# Patient Record
Sex: Male | Born: 1965 | Hispanic: No | Marital: Married | Smoking: Never smoker
Health system: Southern US, Community
[De-identification: ages and names within clinical notes are randomized; demographics above are authoritative.]

## PROBLEM LIST (undated history)

## (undated) DIAGNOSIS — E039 Hypothyroidism, unspecified: Secondary | ICD-10-CM

## (undated) DIAGNOSIS — E669 Obesity, unspecified: Secondary | ICD-10-CM

## (undated) DIAGNOSIS — M109 Gout, unspecified: Secondary | ICD-10-CM

## (undated) DIAGNOSIS — J189 Pneumonia, unspecified organism: Secondary | ICD-10-CM

## (undated) DIAGNOSIS — M722 Plantar fascial fibromatosis: Secondary | ICD-10-CM

## (undated) DIAGNOSIS — E291 Testicular hypofunction: Secondary | ICD-10-CM

## (undated) HISTORY — PX: OTHER SURGICAL HISTORY: SHX169

## (undated) HISTORY — PX: COLONOSCOPY: SHX174

## (undated) HISTORY — DX: Testicular hypofunction: E29.1

## (undated) HISTORY — DX: Pneumonia, unspecified organism: J18.9

## (undated) HISTORY — DX: Gout, unspecified: M10.9

## (undated) HISTORY — DX: Plantar fascial fibromatosis: M72.2

## (undated) HISTORY — DX: Obesity, unspecified: E66.9

---

## 2008-02-13 ENCOUNTER — Ambulatory Visit: Payer: Self-pay | Admitting: Cardiovascular Disease

## 2008-02-13 ENCOUNTER — Observation Stay (HOSPITAL_COMMUNITY): Admission: EM | Admit: 2008-02-13 | Discharge: 2008-02-14 | Payer: Self-pay | Admitting: Emergency Medicine

## 2010-12-18 NOTE — Discharge Summary (Signed)
Aaron Jackson, Aaron Jackson NO.:  [id number]   MEDICAL RECORD NO.:  [id number]          PATIENT TYPE:  OBV   LOCATION:  2035                         FACILITY:  [facility]   PHYSICIAN:  Elmore Guise., M.D.DATE OF BIRTH:  01-08-1966   DATE OF ADMISSION:  02/13/2008  DATE OF DISCHARGE:  02/14/2008                               DISCHARGE SUMMARY   DISCHARGE DIAGNOSIS:  Chest and arm pain.   HISTORY OF PRESENT ILLNESS:  Mr. Lank is a 45 year old male with no  significant past medical history who presented with shoulder, arm and  chest discomfort.  The patient reports he was out at the local swim meet  when he started having some numbness and tingling down his left arm.  This then started radiating to his left chest area.  He became short of  breath.  His symptoms continued despite sitting down, resting, getting  something to drink.  He then came to the emergency room for further  evaluation.  There he was given an aspirin with improvement in his  symptoms.  He was admitted overnight for observation.   HOSPITAL COURSE:  The patient's hospital course was uncomplicated.  He  was admitted for 24-hour observation.  He had no further chest pain.  His enzymes and EKGs were normal.  He will be discharged home today to  be scheduled for an outpatient stress echo.   DISCHARGE MEDICATIONS:  Aspirin 325 mg daily.   He is to call the office if he has any further problems or concerns.      Elmore Guise., M.D.  Electronically Signed     TWK/MEDQ  D:  02/15/2008  T:  02/15/2008  Job:  [id number]

## 2010-12-18 NOTE — H&P (Signed)
NAMENERY, FRAPPIER NO.:  [id number]   MEDICAL RECORD NO.:  [id number]          PATIENT TYPE:  OBV   LOCATION:  2035                         FACILITY:  [facility]   PHYSICIAN:  Christell Faith, MD   DATE OF BIRTH:  June 19, 1966   DATE OF ADMISSION:  02/13/2008  DATE OF DISCHARGE:                              HISTORY & PHYSICAL   Admit to Dr. Lady Deutscher with [facility] cardiology.   The patient does not have a primary care physician.   CHIEF COMPLAINT:  Left arm numbness.   HISTORY OF PRESENT ILLNESS:  This is a 45 year old white man who is  basically healthy.  He was watching his children swim at a swimming  today and noticed left arm numbness and tingling.  This persisted  throughout the day and was intermittently associated with mild left-  sided chest pressure.  It was also associated with intermittent  lightheadedness and left leg numbness.  For all these reasons, he  presented to the emergency department where he is currently symptom-  free.   PAST MEDICAL HISTORY:  None.   SOCIAL HISTORY:  Lives in [place] with his wife and 3 children.  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  He is a nonsmoker,  does not use illegal drugs.   FAMILY HISTORY:  Mother is alive with congestive heart failure at age  32.  Father deceased from lung cancer.  The patient has 6 sisters who  are healthy.   REVIEW OF SYSTEMS:  Positive for recent onset erectile dysfunction and  sedentary lifestyle.  Otherwise, the balance of 14 systems is reviewed  and is negative.   ALLERGIES:  None.   MEDICINES:  None.   PHYSICAL EXAMINATION:  VITAL SIGNS:  Temperature 98.5; pulse initially  103, repeat 85; blood pressure initially 141/84, repeat 125/73; and  saturation initially 94% on room air, repeat 98%.  GENERAL:  This is a very pleasant white man who is in no acute distress.  He appears quite comfortable.  HEENT:  Head normocephalic, atraumatic.  Pupils equal,  round, and  reactive to light.  Extraocular movements are intact.  Sclerae are  clear.  Mucous membranes are moist.  Dentition is good.  NECK:  Supple with no lymphadenopathy.  No carotid bruits.  Neck veins  are flat.  CARDIAC:  Normal rate, regular rhythm.  No murmurs or gallops.  LUNGS:  Clear to auscultation bilaterally without wheezing or rales.  ABDOMEN:  Soft, nontender, and nondistended with normal bowel sounds.  EXTREMITIES:  2+ dorsalis pedis and radial pulses bilaterally.  No  clubbing, cyanosis, or edema.  SKIN:  No rash or lesions.  MUSCULOSKELETAL:  No acute joint deformities or effusions.  NEUROLOGIC:  Awake, alert, and oriented x3.  5/5 strength in all 4  extremities.  Cranial nerves partially tested reveal normal and  symmetric facial expressions.  Tongue midline.  Jaw midline.  Shoulder  shrug normal.  Peripheral sensation is intact.  Facial sensation is  intact.   DIAGNOSTIC TESTS:  Chest x-ray shows no acute process.  Head CT is  unremarkable.  Electrocardiogram sinus rhythm 85 beats per minute, no  ischemic changes.   LABORATORY:  White blood cell 8.1, hemoglobin 13.9, and platelets 234.  Sodium 140, potassium 4, BUN 14, creatinine 1, glucose 93, total  bilirubin 1.1, INR 1.0, and point of care troponin negative.   IMPRESSION:  A 45 year old white man with atypical chest pain symptoms.   PLAN:  1. Admit to Dr. Lady Deutscher, rule out myocardial infarction by      cycling serial EKGs and cardiac enzymes.  The patient will be      monitored on telemetry.  2. Check D-dimer given that the patient travels frequently and does      work while seated for up to 16 consecutive hours throughout the      day.  3. Check a fasting lipid panel and consider statin initiation.  4. Initiate aspirin.  5. The patient will probably benefit from an outpatient stress test.  6. Recommended to the patient that he should establish care with a      primary care physician for health  care maintenance and for further      discussion of his recent onset erectile dysfunction.      Christell Faith, MD  Electronically Signed     NDL/MEDQ  D:  02/13/2008  T:  02/14/2008  Job:  [id number]

## 2011-05-02 LAB — POCT CARDIAC MARKERS
CKMB, poc: 1.3
CKMB, poc: <1 — ABNORMAL LOW
Myoglobin, poc: 193
Myoglobin, poc: 225
Operator id: 151321
Operator id: 294521
Troponin i, poc: <0.05
Troponin i, poc: <0.05

## 2011-05-02 LAB — COMPREHENSIVE METABOLIC PANEL
ALT: 49
AST: 26
Albumin: 4
Alkaline Phosphatase: 65
CO2: 28
Chloride: 107
GFR calc Af Amer: >60
Potassium: 4
Total Bilirubin: 1.1

## 2011-05-02 LAB — CARDIAC PANEL(CRET KIN+CKTOT+MB+TROPI)
CK, MB: 1.9
Relative Index: 0.4
Total CK: 433 — ABNORMAL HIGH
Troponin I: <0.01

## 2011-05-02 LAB — CBC
Platelets: 234
RBC: 4.6
WBC: 8.1

## 2011-05-02 LAB — LIPID PANEL
Triglycerides: 81
VLDL: 16

## 2012-07-05 DIAGNOSIS — J189 Pneumonia, unspecified organism: Secondary | ICD-10-CM

## 2012-07-05 HISTORY — DX: Pneumonia, unspecified organism: J18.9

## 2012-07-21 ENCOUNTER — Inpatient Hospital Stay (HOSPITAL_COMMUNITY)
Admission: AD | Admit: 2012-07-21 | Discharge: 2012-07-25 | DRG: 193 | Disposition: A | Discharge status: Home / Self Care | Payer: Managed Care, Other (non HMO) | Source: Ambulatory Visit | Attending: Internal Medicine | Admitting: Internal Medicine

## 2012-07-21 DIAGNOSIS — R0902 Hypoxemia: Secondary | ICD-10-CM

## 2012-07-21 DIAGNOSIS — Z6831 Body mass index (BMI) 31.0-31.9, adult: Secondary | ICD-10-CM

## 2012-07-21 DIAGNOSIS — E663 Overweight: Secondary | ICD-10-CM | POA: Diagnosis present

## 2012-07-21 DIAGNOSIS — J320 Chronic maxillary sinusitis: Secondary | ICD-10-CM | POA: Diagnosis present

## 2012-07-21 DIAGNOSIS — J189 Pneumonia, unspecified organism: Principal | ICD-10-CM | POA: Diagnosis present

## 2012-07-21 DIAGNOSIS — E039 Hypothyroidism, unspecified: Secondary | ICD-10-CM | POA: Diagnosis present

## 2012-07-21 DIAGNOSIS — J9600 Acute respiratory failure, unspecified whether with hypoxia or hypercapnia: Secondary | ICD-10-CM | POA: Diagnosis present

## 2012-07-21 DIAGNOSIS — Z79899 Other long term (current) drug therapy: Secondary | ICD-10-CM

## 2012-07-21 DIAGNOSIS — J9601 Acute respiratory failure with hypoxia: Secondary | ICD-10-CM | POA: Diagnosis present

## 2012-07-21 HISTORY — DX: Hypothyroidism, unspecified: E03.9

## 2012-07-21 LAB — COMPREHENSIVE METABOLIC PANEL
ALT: 72 U/L — ABNORMAL HIGH (ref 0–53)
AST: 37 U/L (ref 0–37)
Albumin: 2.7 g/dL — ABNORMAL LOW (ref 3.5–5.2)
Calcium: 8.7 mg/dL (ref 8.4–10.5)
GFR calc Af Amer: >90 mL/min (ref >90)
Glucose, Bld: 90 mg/dL (ref 70–99)
Potassium: 3.9 mEq/L (ref 3.5–5.1)
Sodium: 143 mEq/L (ref 135–145)
Total Protein: 6.5 g/dL (ref 6.0–8.3)

## 2012-07-21 LAB — CBC
HCT: 37.2 % — ABNORMAL LOW (ref 39.0–52.0)
Hemoglobin: 12.3 g/dL — ABNORMAL LOW (ref 13.0–17.0)
MCHC: 33.1 g/dL (ref 30.0–36.0)
MCV: 89 fL (ref 78.0–100.0)

## 2012-07-21 MED ORDER — ACETAMINOPHEN 325 MG PO TABS: 650.0000 mg | ORAL_TABLET | Freq: Four times a day (QID) | ORAL | Status: DC | PRN

## 2012-07-21 MED ORDER — SODIUM CHLORIDE 0.9 % IJ SOLN
3.0000 mL | Freq: Two times a day (BID) | INTRAMUSCULAR | Status: DC
  Administered 2012-07-21 – 2012-07-23 (×5): 3 mL via INTRAVENOUS

## 2012-07-21 MED ORDER — ENOXAPARIN SODIUM 40 MG/0.4ML ~~LOC~~ SOLN
40.0000 mg | SUBCUTANEOUS | Status: DC
  Administered 2012-07-21 – 2012-07-24 (×4): 40 mg via SUBCUTANEOUS
  Filled 2012-07-21 (×6): qty 0.4

## 2012-07-21 MED ORDER — ACETAMINOPHEN 650 MG RE SUPP: 650.0000 mg | Freq: Four times a day (QID) | RECTAL | Status: DC | PRN

## 2012-07-21 MED ORDER — SODIUM CHLORIDE 0.9 % IJ SOLN: 3.0000 mL | INTRAMUSCULAR | Status: DC | PRN

## 2012-07-21 MED ORDER — SODIUM CHLORIDE 0.9 % IJ SOLN
3.0000 mL | Freq: Two times a day (BID) | INTRAMUSCULAR | Status: DC
  Administered 2012-07-21 – 2012-07-23 (×3): 3 mL via INTRAVENOUS

## 2012-07-21 MED ORDER — AMOXICILLIN-POT CLAVULANATE 875-125 MG PO TABS
1.0000 | ORAL_TABLET | Freq: Two times a day (BID) | ORAL | Status: DC
  Administered 2012-07-21: 1 via ORAL
  Filled 2012-07-21 (×3): qty 1

## 2012-07-21 MED ORDER — SODIUM CHLORIDE 0.9 % IV SOLN: 250.0000 mL | INTRAVENOUS | Status: AC | PRN

## 2012-07-21 MED ORDER — ALBUTEROL SULFATE (5 MG/ML) 0.5% IN NEBU
2.5000 mg | INHALATION_SOLUTION | RESPIRATORY_TRACT | Status: DC | PRN
  Administered 2012-07-22 (×2): 2.5 mg via RESPIRATORY_TRACT
  Filled 2012-07-21 (×2): qty 0.5

## 2012-07-21 MED ORDER — LEVOTHYROXINE SODIUM 100 MCG PO TABS
100.0000 ug | ORAL_TABLET | Freq: Every day | ORAL | Status: DC
  Administered 2012-07-22 – 2012-07-25 (×4): 100 ug via ORAL
  Filled 2012-07-21 (×5): qty 1

## 2012-07-21 NOTE — H&P (Signed)
Vital Signs  Entered weight:  255  lbs., Calculated Weight: 255 lbs., ( 115.67 kg) Height: 75 in., ( 190.50 cm) Temperature: 98.3 deg F, Temperature site: oral Pulse rate: 84 Pulse rhythm: regular Respirations: 16  Blood Pressure #1: 110 / 60 mm Hg    BMI: 31.87 BSA: 2.43 Wt Chg: -3.13 lbs since 07/13/2012  Vitals entered by: Sara Chu LPN on July 21, 2012 4:40 PM  Pulse Oximetry  O2 Saturation: 84 % Post Nebs O2 Saturation: 84 % Comments: RA  Placed on O2 at 2 L went to 92% After Neb was 90% Ambulated off O2 after Neb at 30 ft desated to 78%  Nebulizer Treatment  Medication 1st Dose: Albuterol Sulfate 2.5mg  Nebulizer Tx: Given Comment: Pt tolerated procedure well.  o2 sats were @ 92 after completion of updraft tx.        Risk Factors:   Tobacco use:  never smoker Passive smoke exposure:  no Drug use:  no HIV high-risk behavior:  no Caffeine use:  3 drinks per day Alcohol use:  yes    Type:  wine twice a week    Drinks per day:  <1 Exercise:  no Seatbelt use:  100 % Sun Exposure:  occasionally  Colonoscopy History:    Date of Last Colonoscopy:  04/13/2010  History of Present Illness  History from: patient Reason for visit: See chief complaint Chief Complaint: Still feeling bad-cough History of Present Illness: Several days of what looked like to be a viral infection, progressed to sinusitis, WBC 7 Got Augmentin, now on 8th day and just not better, sinuses better, but SOB and just winded if he moves around.  Can sit and do desk work but if he gets up and starts walking or has to go up steps, he is out of breath.  No fever, has no colored drainage, ST, ear pain, CP, wheezing n/v, abd pain, back pain, edema Eating ok, but has lost 3 lbs No diarrhea from Augmentin  Seen 07/13/12 for work in [place] and was sick for 1-2 weeks.  Now Throat and vocal cord issues.  B Maxillary Sinus Pressure.  PNgtt.  Tm 102.6..  HA.  coughing with min sputum,   Phlegm sometimes  green in color. (-) Wheezing/SOB.  Ears are not hurting. Pt feels weak but not achey. Tylenol prn.  light sensitivity.  He had Fever for 10 days prior to that OV.  Problem # 1:  HEADACHE (ICD-784.0) Sxs have develepoed and sound like Maxilary Sinusitis Treat with Abx.  Problem # 2:  FEVER UNSPECIFIED (ICD-780.60) Now > 7 days and no longer seems Viral.  Problem # 3:  MAXILLARY SINUSITIS (ICD-473.0) Will Treat with Augmentin with Probitics.  RBSEs and alternatives discussed. Also MMW. I scraped off the thick coating. Did not seem thrush??  He called 12/12 with continued fevers and BENZONATATE 200 MG CAPS (BENZONATATE) 1 po tid prn and  PREDNISONE 20 MG TABS (PREDNISONE) 1 po qd x 4 days called in.  No Longer having Fevers but hypoxic   Review of Systems  General:       Complains of fatigue, weight loss.        Denies fevers, chills, sweats, anorexia, malaise, sleep disturbance.   Eyes:       Denies irritation, discharge.   Ears/Nose/Throat:       Denies earache, ear discharge, decreased hearing, nasal congestion, nosebleeds, sore throat.   Cardiovascular:       Complains of dyspnea on exertion.  Denies chest pains, palpitations, syncope, orthopnea, peripheral edema.   Respiratory:       Complains of cough, dyspnea.        Denies excessive sputum, hemoptysis, wheezing.   Gastrointestinal:       Denies nausea, vomiting, diarrhea, constipation, heartburn, abdominal pain, melena, hematochezia.   Musculoskeletal:       Denies back pain, muscle cramps, muscle weakness.   Skin:       Denies rash, itching, dryness.   Neurologic:       Denies vertigo, dizziness.   Endocrine:       Denies polydipsia, polyphagia, polyuria.   Heme/Lymphatic:       Denies abnormal bruising, bleeding, enlarged lymph nodes.   Allergic/Immunologic:       Complains of persistent infections.        Denies hay fever.    Past History Past Medical History (reviewed - no changes required): Abnormal  TSH/Hypothyroidism,  Low HDL,  Gout/Elevated Uric acid,  hypogonadism Atypical Chest pain admission; 7/09; numbness (L) arm; CCT (-), CXR (-), Serum 2D echo (-) Foot, toe, head issues Overweight Plantar Fasciitis.  Surgical History (reviewed - no changes required): none Social History (reviewed - no changes required): Married with 3 boys Education: Masters at [employer] Occupation: Scientist, research (physical sciences) No tobacco.    Physical Exam  General appearance: well nourished, well hydrated, diaphoresis  Eyes  External: conjunctivae and lids normal  Ears, Nose and Throat  Nasal: clear drainage  Pharynx: mildly injected post phayrnx  Neck  Neck: supple, no masses, trachea midline  Respiratory  Respiratory effort: no intercostal retractions or use of accessory muscles Auscultation: no rales, rhonchi, or wheezes  Cardiovascular  Auscultation: S1, S2, no murmur, rub, or gallop Periph. circulation: no cyanosis, clubbing, edema  Gastrointestinal  Abdomen: soft, non-tender, no masses, bowel sounds normal  Lymphatic  Neck: no cervical adenopathy  Musculoskeletal  Gait and station: normal  Skin  Inspection: pale, w/d Speech: Normal  Mental Status Exam  Judgment, insight: intact Orientation: oriented to time, place, and person Memory: intact   Impression & Recommendations:  Problem # 1:  Cough (ICD-786.2) ([id number]) Assessment: Deteriorated  Orders: Chest X-Ray (Pa & L) (CPT-71020)  LLL hazy per Dr Timothy Lasso CBC/Platelets [phone])  WBC  11 Pulse Ox ([id number]) see notes  ? PNA and Hypoxic - Admit for eval and Rx and O2/CTPA   Orders: Chest X-Ray (Pa & L) (CPT-71020) CBC/Platelets ([id number]) Pulse Ox ([id number]) Peak Flow ([id number])   Problem # 2:  Hypoxia (ICD-799.02) (ICD10-R09.01) Assessment: New  Orders: Chest X-Ray (Pa & L) (CPT-71020) Pulse Ox ([id number]) Peak Flow ([id number]) Inhalation Treatment ([id number]) Deteriorated to 78%  with ambulatio After assessment and discussion with Dr Timothy Lasso, will admit to hospital for eval and management Pt is advised and he states understanding He is to be transported by his wife to direct admissione  Problem # 3:  MAXILLARY SINUSITIS (ICD-473.0) (ICD10-J32.0) Assessment: Improved  His updated medication list for this problem includes:    Benzonatate 200 Mg Caps (Benzonatate) .Marland Kitchen... 1 po tid prn    Augmentin 875-125 Mg Tabs (Amoxicillin-pot clavulanate) ..... One po bid   Problem # 4:  FATIGUE (ICD-780.79) (VFI43-P29.51) Assessment: Deteriorated  Complete Medication List: 1)  Benzonatate 200 Mg Caps (Benzonatate) .Marland Kitchen.. 1 po tid prn 2)  Augmentin 875-125 Mg Tabs (Amoxicillin-pot clavulanate) .... One po bid 3)  Synthroid 100 Mcg Tabs (Levothyroxine sodium) .Marland Kitchen.. 1 po qd   Comments: ADMIT    Electronically signed by  Gwen Pounds MD on 07/21/2012 at 5:33 PM  ________________________________________________________________________

## 2012-07-22 ENCOUNTER — Inpatient Hospital Stay (HOSPITAL_COMMUNITY): Payer: Managed Care, Other (non HMO)

## 2012-07-22 ENCOUNTER — Encounter (HOSPITAL_COMMUNITY): Payer: Self-pay | Admitting: Radiology

## 2012-07-22 LAB — MRSA PCR SCREENING: MRSA by PCR: NEGATIVE

## 2012-07-22 MED ORDER — ALBUTEROL SULFATE (5 MG/ML) 0.5% IN NEBU
2.5000 mg | INHALATION_SOLUTION | Freq: Two times a day (BID) | RESPIRATORY_TRACT | Status: DC
  Administered 2012-07-22 (×2): 2.5 mg via RESPIRATORY_TRACT
  Filled 2012-07-22: qty 0.5

## 2012-07-22 MED ORDER — PIPERACILLIN-TAZOBACTAM 3.375 G IVPB 30 MIN: 3.3750 g | Freq: Four times a day (QID) | INTRAVENOUS | Status: DC

## 2012-07-22 MED ORDER — VANCOMYCIN HCL IN DEXTROSE 1-5 GM/200ML-% IV SOLN
1000.0000 mg | Freq: Once | INTRAVENOUS | Status: AC
  Administered 2012-07-22: 1000 mg via INTRAVENOUS
  Filled 2012-07-22: qty 200

## 2012-07-22 MED ORDER — IOHEXOL 350 MG/ML SOLN
80.0000 mL | Freq: Once | INTRAVENOUS | Status: AC | PRN
  Administered 2012-07-22: 80 mL via INTRAVENOUS

## 2012-07-22 MED ORDER — PIPERACILLIN-TAZOBACTAM 3.375 G IVPB
3.3750 g | Freq: Three times a day (TID) | INTRAVENOUS | Status: DC
  Administered 2012-07-22 – 2012-07-24 (×7): 3.375 g via INTRAVENOUS
  Filled 2012-07-22 (×10): qty 50

## 2012-07-22 MED ORDER — METHYLPREDNISOLONE SODIUM SUCC 40 MG IJ SOLR
40.0000 mg | INTRAMUSCULAR | Status: AC
Start: 2012-07-22 — End: 2012-07-23
  Administered 2012-07-22 – 2012-07-23 (×2): 40 mg via INTRAVENOUS
  Filled 2012-07-22 (×2): qty 1

## 2012-07-22 NOTE — Progress Notes (Signed)
Patient was on r/a eating lunch, denies c/o, checked oxygen sat 79%.  Patient denied shortness of breath or pain in any way. Placed patient back on 2 liters [place] but oxygen sats still 81-84%.  Respiratory rate 18-20, lung sounds diminished with a productive cough of white phlem.  Other vss bp 109/61 hr 99 patient afebrile.  1250 gave patient a breathing treatment and monitored oxygen sats.  1315 patient oxygen sats still in the 80"s even with coughing and deep breathing and encouraging incentive spirameter but sat's did not improve.  I called rapid response rn to assess patient (patient still is not symptomatic) - she increased oxygen to 4liters [place] and observed with oxygen sats 88% (patient denies any c/o's of pain or sob).  Patient at times increased sat to 90% for a second but still remained at 88% on four liters [place].  1400 Call placed to dr Russo's office and dr Jarold Motto on call and notified of patient's oxygen needs and orders received.  Respiratory called to assist with venturi mask to get sat's greater then 90%.  Continue to monitor

## 2012-07-22 NOTE — Progress Notes (Signed)
Subjective: Admitted with 2.5 weeks of illness. Fever Cough etc. Was improving on Augmentin but was getting more SOB and presented to our office yesterday with significant hypoxia - Sats into 78% on RA while moving. Admitted for eval and Rx. CTPA - No PE but multifocal PNA. Will Rx with O2, Nebs, Pulm Toilet, time, and will switch from oral to 2 days IV Abx. Will also add IV Steroids.  Objective: Vital signs in last 24 hours: Temp:  [97.5 F (36.4 C)-97.9 F (36.6 C)] 97.9 F (36.6 C) (12/18 0449) Pulse Rate:  [80-98] 85  (12/18 0449) Resp:  [18] 18  (12/18 0449) BP: (96-129)/(58-74) 96/58 mmHg (12/18 0449) SpO2:  [89 %-96 %] 96 % (12/18 0449) Weight:  [114.851 kg (253 lb 3.2 oz)] 114.851 kg (253 lb 3.2 oz) (12/17 2029) Weight change:  Last BM Date: 07/20/12  CBG (last 3)  No results found for this basename: GLUCAP:3 in the last 72 hours  Intake/Output from previous day:  Intake/Output Summary (Last 24 hours) at 07/22/12 0811 Last data filed at 07/22/12 0641  Gross per 24 hour  Intake    720 ml  Output    700 ml  Net     20 ml   12/17 0701 - 12/18 0700 In: 720 [P.O.:720] Out: 700 [Urine:700]   Physical Exam  General appearance: Looks fine in bed wearing Oxygen Eyes: no scleral icterus Throat: oropharynx moist without erythema Resp: Distant and clear Cardio: Reg GI: soft, non-tender; bowel sounds normal; no masses,  no organomegaly Extremities: no clubbing, cyanosis or edema   Lab Results:  [facility] 07/21/12 2044  NA 143  K 3.9  CL 104  CO2 29  GLUCOSE 90  BUN 16  CREATININE 1.02  CALCIUM 8.7  MG --  PHOS --     Basename 07/21/12 2044  AST 37  ALT 72*  ALKPHOS 71  BILITOT 0.4  PROT 6.5  ALBUMIN 2.7*     Basename 07/21/12 2044  WBC 10.8*  NEUTROABS --  HGB 12.3*  HCT 37.2*  MCV 89.0  PLT 403*    Lab Results  Component Value Date   INR 1.0 02/13/2008    Micro Results: No results found for this or any previous visit (from the past  240 hour(s)).   Studies/Results: Ct Angio Chest Pe W/cm &/or Wo Cm  07/22/2012  *RADIOLOGY REPORT*  Clinical Data: Hypoxia, productive cough.  CT ANGIOGRAPHY CHEST  Technique:  Multidetector CT imaging of the chest using the standard protocol during bolus administration of intravenous contrast. Multiplanar reconstructed images including MIPs were obtained and reviewed to evaluate the vascular anatomy.  Contrast: 80mL OMNIPAQUE IOHEXOL 350 MG/ML SOLN  Comparison: 02/13/2008 radiograph  Findings: Pulmonary arterial branches are patent.  Heart size upper normal.  There is a small pericardial effusion.  No pleural effusions.  No intrathoracic lymphadenopathy.  Normal caliber aorta and branch vessels.  Limited images through the upper abdomen show no acute finding. Hepatic steatosis suggested.  Central airways are patent. There is a clustered nodularity and peribronchial thickening involving the right upper lobe anteriorly, lingula, middle lobe, and bilateral lower lobe.  Densest area of consolidation is within the left lung base posteriorly.  T4 vertebral body hemangioma.  No acute or aggressive osseous lesion.  IMPRESSION: No pulmonary embolism.  Multifocal areas of consolidation, peribronchial thickening, and clustered nodularity.  Most in keeping with multifocal pneumonia. There may be an underlying process as well.  Therefore, recommend a follow-up chest CT after appropriate therapy  to document resolution.  Small pericardial effusion.   Original Report Authenticated By: Jearld Lesch, M.D.      Medications: Scheduled:    . albuterol  2.5 mg Nebulization Q12H  . enoxaparin (LOVENOX) injection  40 mg Subcutaneous Q24H  . levothyroxine  100 mcg Oral QAC breakfast  . methylPREDNISolone (SOLU-MEDROL) injection  40 mg Intravenous Q24H  . piperacillin-tazobactam (ZOSYN)  IV  3.375 g Intravenous Q8H  . sodium chloride  3 mL Intravenous Q12H  . sodium chloride  3 mL Intravenous Q12H  . vancomycin   1,000 mg Intravenous Once   Continuous:    Assessment/Plan: Active Problems:  Hypoxia  Pneumonia  Hypothyroid  Clinical and CT picture C/W Multifocal PNA and Hypoxia.  A follow-up chest CT will be performed after appropriate therapy to document resolution.  CXR tomorrow. Will Rx with O2, Nebs, Pulm Toilet, time, and will switch from oral to 2 days IV Abx. Will also add IV Steroids x 2 days. On D/C he may need outpt O2.   ID -  Anti-infectives     Start     Dose/Rate Route Frequency Ordered Stop   07/22/12 0830   piperacillin-tazobactam (ZOSYN) IVPB 3.375 g        3.375 g 12.5 mL/hr over 240 Minutes Intravenous 3 times per day 07/22/12 0809     07/22/12 0815   piperacillin-tazobactam (ZOSYN) IVPB 3.375 g  Status:  Discontinued        3.375 g 100 mL/hr over 30 Minutes Intravenous 4 times per day 07/22/12 0805 07/22/12 0808   07/22/12 0815   vancomycin (VANCOCIN) IVPB 1000 mg/200 mL premix        1,000 mg 200 mL/hr over 60 Minutes Intravenous  Once 07/22/12 0805     07/21/12 2200   amoxicillin-clavulanate (AUGMENTIN) 875-125 MG per tablet 1 tablet  Status:  Discontinued        1 tablet Oral Every 12 hours 07/21/12 2024 07/22/12 0805         DVT Prophylaxis    LOS: 1 day   Kysha Muralles M 07/22/2012, 8:11 AM

## 2012-07-22 NOTE — Progress Notes (Signed)
Nutrition Brief Note  Patient identified on the Malnutrition Screening Tool (MST) Report. Upon further chart review, pt is eating most of his meals. States that his appetite is improving daily. Pt unable to state his usual weight. Per H&P, pt with 3 lb weight loss.  Body mass index is 31.65 kg/(m^2). Pt meets criteria for Obese Class I based on current BMI.   Current diet order is Regular, patient is consuming approximately 75-100% of meals at this time. Labs and medications reviewed.   No nutrition interventions warranted at this time. If nutrition issues arise, please consult RD.   Aaron Motto MS, RD, LDN Pager: [phone] After-hours pager: [phone]

## 2012-07-23 ENCOUNTER — Inpatient Hospital Stay (HOSPITAL_COMMUNITY): Payer: Managed Care, Other (non HMO)

## 2012-07-23 DIAGNOSIS — J9601 Acute respiratory failure with hypoxia: Secondary | ICD-10-CM | POA: Diagnosis present

## 2012-07-23 DIAGNOSIS — J189 Pneumonia, unspecified organism: Principal | ICD-10-CM

## 2012-07-23 DIAGNOSIS — J9600 Acute respiratory failure, unspecified whether with hypoxia or hypercapnia: Secondary | ICD-10-CM

## 2012-07-23 LAB — CBC
HCT: 36.3 % — ABNORMAL LOW (ref 39.0–52.0)
MCH: 28.8 pg (ref 26.0–34.0)
MCV: 89.4 fL (ref 78.0–100.0)
RDW: 13.5 % (ref 11.5–15.5)
WBC: 9.9 10*3/uL (ref 4.0–10.5)

## 2012-07-23 LAB — COMPREHENSIVE METABOLIC PANEL
BUN: 14 mg/dL (ref 6–23)
CO2: 27 mEq/L (ref 19–32)
Chloride: 104 mEq/L (ref 96–112)
Creatinine, Ser: 1.12 mg/dL (ref 0.50–1.35)
GFR calc non Af Amer: 76 mL/min — ABNORMAL LOW (ref >90)
Total Bilirubin: 0.3 mg/dL (ref 0.3–1.2)

## 2012-07-23 MED ORDER — ALBUTEROL SULFATE HFA 108 (90 BASE) MCG/ACT IN AERS
2.0000 | INHALATION_SPRAY | Freq: Three times a day (TID) | RESPIRATORY_TRACT | Status: DC
  Filled 2012-07-23: qty 6.7

## 2012-07-23 MED ORDER — LEVOFLOXACIN IN D5W 750 MG/150ML IV SOLN
750.0000 mg | INTRAVENOUS | Status: AC
  Administered 2012-07-23 – 2012-07-24 (×2): 750 mg via INTRAVENOUS
  Filled 2012-07-23 (×2): qty 150

## 2012-07-23 NOTE — Consult Note (Signed)
Patient: Aaron Jackson DOB: [birth year] Date of Admission: 07/21/2012            [facility]   Date of Consult: 07/23/2012 MD requesting consult:  Timothy Lasso  Reason for consult: PNA, hypoxia   HPI -  46yo male never smoker with minimal PMH seen as outpt initially 12/9 with fevers, viral illness, ?sinusitis.  At that time flu, strep were neg.  Failed augmentin, returned to PCP 12/17 with >2 weeks fever, cough, progressive SOB and wheezing.  In office had O2 sats 84%, 78% with ambulation.  He was directly admitted r/t hypoxia after failing outpt rx.  CT chest was neg for PE but showed multifocal PNA and PCCM consulted.      Allergies:  No Known Allergies   PMH: Past Medical History  Diagnosis Date  . Hypothyroidism     Home meds: Medications Prior to Admission  Medication Sig Dispense Refill  . Acetaminophen (TYLENOL PO) Take 2 tablets by mouth every 4 (four) hours as needed. For headache      . Alum & Mag Hydroxide-Simeth (MAGIC MOUTHWASH) SOLN Take 10 mLs by mouth 4 (four) times daily as needed. For white patches in mouth      . amoxicillin-clavulanate (AUGMENTIN) 875-125 MG per tablet Take 1 tablet by mouth 2 (two) times daily. For 10 days started 12.9.13 ending 12.19.13      . benzonatate (TESSALON) 100 MG capsule Take 100 mg by mouth 3 (three) times daily as needed.      Marland Kitchen levothyroxine (SYNTHROID, LEVOTHROID) 100 MCG tablet Take 100 mcg by mouth daily.      . predniSONE (DELTASONE) 20 MG tablet Take 20 mg by mouth daily. For 4 days. Started 12.13.13 ending 12.17.13         Social Hx: History   Social History  . Marital Status: Married    Spouse Name: N/A    Number of Children: N/A  . Years of Education: N/A   Occupational History  . Not on file.   Social History Main Topics  . Smoking status: Never Smoker   . Smokeless tobacco: Not on file  . Alcohol Use: 1.2 oz/week    2 Glasses of wine per week  . Drug Use:   . Sexually Active:    Other Topics Concern  . Not  on file   Social History Narrative  . No narrative on file     Family Hx: Family History  Problem Relation Age of Onset  . Congestive Heart Failure Mother   . Lung cancer Father      ROS: Review of Systems - C/o mild SOB although improved, cough productive white sputum.  Denies chest pain, hemoptysis, lightheadedness.  All other systems reviewed and were neg.   Filed Vitals:   07/22/12 1800 07/22/12 1900 07/23/12 0014 07/23/12 0720  BP: 122/78 121/79 110/66 120/72  Pulse: 95 97 94 80  Temp:  98.3 F (36.8 C) 98.1 F (36.7 C) 97.9 F (36.6 C)  TempSrc:  Oral Oral Oral  Resp: 21 19 14 16   Height:      Weight:   251 lb 12.3 oz (114.2 kg)   SpO2: 91% 93% 88% 90%    chest X-ray Dg Chest 2 View  07/23/2012  *RADIOLOGY REPORT*  Clinical Data: Follow up pneumonia, cough  CHEST - 2 VIEW  Comparison: 07/22/2012  Findings: Cardiomediastinal silhouette is stable.  Again noted left basilar atelectasis or scarring.  Persistent left perihilar streaky infiltrate.  No pulmonary edema.  IMPRESSION:   Again noted left basilar atelectasis or scarring.  Persistent left perihilar streaky infiltrate.  No pulmonary edema.   Original Report Authenticated By: Natasha Mead, M.D.    Ct Angio Chest Pe W/cm &/or Wo Cm  07/22/2012  *RADIOLOGY REPORT*  Clinical Data: Hypoxia, productive cough.  CT ANGIOGRAPHY CHEST  Technique:  Multidetector CT imaging of the chest using the standard protocol during bolus administration of intravenous contrast. Multiplanar reconstructed images including MIPs were obtained and reviewed to evaluate the vascular anatomy.  Contrast: 80mL OMNIPAQUE IOHEXOL 350 MG/ML SOLN  Comparison: 02/13/2008 radiograph  Findings: Pulmonary arterial branches are patent.  Heart size upper normal.  There is a small pericardial effusion.  No pleural effusions.  No intrathoracic lymphadenopathy.  Normal caliber aorta and branch vessels.  Limited images through the upper abdomen show no acute finding.  Hepatic steatosis suggested.  Central airways are patent. There is a clustered nodularity and peribronchial thickening involving the right upper lobe anteriorly, lingula, middle lobe, and bilateral lower lobe.  Densest area of consolidation is within the left lung base posteriorly.  T4 vertebral body hemangioma.  No acute or aggressive osseous lesion.  IMPRESSION: No pulmonary embolism.  Multifocal areas of consolidation, peribronchial thickening, and clustered nodularity.  Most in keeping with multifocal pneumonia. There may be an underlying process as well.  Therefore, recommend a follow-up chest CT after appropriate therapy to document resolution.  Small pericardial effusion.   Original Report Authenticated By: Jearld Lesch, M.D.    Dg Chest Port 1 View  07/22/2012  *RADIOLOGY REPORT*  Clinical Data: Pneumonia, increased oxygen requirements  PORTABLE CHEST - 1 VIEW  Comparison: Portable exam 1443 hours compared to 02/13/2008  Findings: Borderline enlargement of cardiac silhouette. Mediastinal contours and pulmonary vascularity normal. Left basilar atelectasis and questionable left perihilar infiltrate. No pleural effusion or pneumothorax. No bones unremarkable.  IMPRESSION: Left basilar atelectasis and questionable left perihilar infiltrate.   Original Report Authenticated By: Ulyses Southward, M.D.      CBC    Component Value Date/Time   WBC 9.9 07/23/2012 0428   RBC 4.06* 07/23/2012 0428   HGB 11.7* 07/23/2012 0428   HCT 36.3* 07/23/2012 0428   PLT 378 07/23/2012 0428   MCV 89.4 07/23/2012 0428   MCH 28.8 07/23/2012 0428   MCHC 32.2 07/23/2012 0428   RDW 13.5 07/23/2012 0428     BMET    Component Value Date/Time   NA 139 07/23/2012 0428   K 4.0 07/23/2012 0428   CL 104 07/23/2012 0428   CO2 27 07/23/2012 0428   GLUCOSE 105* 07/23/2012 0428   BUN 14 07/23/2012 0428   CREATININE 1.12 07/23/2012 0428   CALCIUM 8.8 07/23/2012 0428   GFRNONAA 76* 07/23/2012 0428   GFRAA 88*  07/23/2012 0428      EXAM: General: young wdwn male NAD OOB in chair  Neuro: awake, alert, appropriate, MAE  CV: s1s2 rrr PULM: resps even non labored on 5L [place], bibasilar crackles GI: abd soft, +bs Extremities:  Warm and dry, no edema    IMPRESSION/ PLAN:  CAP - multifocal Hypoxia - improved. CT neg PE.  ?Sinusitis   PLAN -  -Cont abx -- will cont vanc/zosyn and add levaquin for atypical coverage for now given prolonged abx as outpt, but low threshold to narrow abx.  -Sputum culture  -Doubt role for steroids - d/c  -Change BD to PRN only  -O2 as needed  -Mobilize  -Aggressive pulm hygiene  -Will need f/u imaging  to ensure resolution-- pt concerned as his dad recently died of "aggressive lung cancer" and was never smoker -CXR in am   See addendum below  [facility], NP 07/23/2012  8:16 AM Pager: [phone]  *Care during the described time interval was provided by me and/or other providers on the critical care team. I have reviewed this patient's available data, including medical history, events of note, physical examination and test results as part of my evaluation.   PCCM Attending addendum: His history suggests an initial viral infection 3 wks ago or so c/b acute sinusitis which was appropriately treated with Augmentin. He no has a cxr and ct chest suggesting bibasilar pneumonia or noninfectious inflammatory process. His care by Dr Timothy Lasso has been impeccable and there is little I would change. I agree with steroids and abx as ordered. He can be safely discharged when he feels that he has the strength to perform ADLs sufficiently. He might require temporary O2 @ discharge until this process resolves. Perhaps the most important thing I have added is reassurance to him that this is not cancer and that it is a reversible process (even if we cannot fully define it)  -Would complete 10 days Levofloxacin -Suggest prednisone 60 mg daily tapering to off over 10-14  days -Assess home O2 needs prior to discharge (it will only be temporary!)  If RA SpO2 is > 85%, he probably doesn't need O2 -Follow up CXR in a week and again in 2-3 wks.  -If symptoms and/or CXR fail to respond to the above therapies, suggest re-eval by pulmonary medicine as outpt   Billy Fischer, MD ; [facility] [phone].  After 5:30 PM or weekends, call [phone]

## 2012-07-23 NOTE — Progress Notes (Signed)
Subjective: In hospital for Multifocal PNA and Hypoxia. Had Worsening Resp status yesterday and moved to Step-Down. He reports he moved @ room without his O2 on and when they checked his sats he was in 70%s. He did not feel any worse. He feels fine at rest. He is starting to cough some stuff up. I updated him and his sister of plan - Sister = RN asked for pulm consult which is reasonable.    Objective: Vital signs in last 24 hours: Temp:  [97.9 F (36.6 C)-98.4 F (36.9 C)] 97.9 F (36.6 C) (12/19 0720) Pulse Rate:  [80-112] 80  (12/19 0720) Resp:  [14-21] 16  (12/19 0720) BP: (98-131)/(61-79) 120/72 mmHg (12/19 0720) SpO2:  [84 %-96 %] 90 % (12/19 0720) FiO2 (%):  [50 %] 50 % (12/18 1444) Weight:  [114.2 kg (251 lb 12.3 oz)-115.5 kg (254 lb 10.1 oz)] 114.2 kg (251 lb 12.3 oz) (12/19 0014) Weight change: 0.649 kg (1 lb 6.9 oz) Last BM Date: 07/20/12  CBG (last 3)  No results found for this basename: GLUCAP:3 in the last 72 hours  Intake/Output from previous day:  Intake/Output Summary (Last 24 hours) at 07/23/12 0751 Last data filed at 07/23/12 0000  Gross per 24 hour  Intake    350 ml  Output   2000 ml  Net  -1650 ml   12/18 0701 - 12/19 0700 In: 350 [IV Piggyback:350] Out: 2000 [Urine:2000]   Physical Exam  General appearance: Looks fine in bed wearing Oxygen - sats 88% on FIO2 after getting back in bed.  Quickly jumped to 95% at rest on FIO2 Eyes: no scleral icterus Throat: oropharynx moist without erythema Resp: Distant and clear Cardio: Reg GI: soft, non-tender; bowel sounds normal; no masses,  no organomegaly Extremities: no clubbing, cyanosis or edema   Lab Results:  [facility] 07/23/12 0428 07/21/12 2044  NA 139 143  K 4.0 3.9  CL 104 104  CO2 27 29  GLUCOSE 105* 90  BUN 14 16  CREATININE 1.12 1.02  CALCIUM 8.8 8.7  MG -- --  PHOS -- --     Basename 07/23/12 0428 07/21/12 2044  AST 32 37  ALT 72* 72*  ALKPHOS 65 71  BILITOT 0.3 0.4   PROT 6.4 6.5  ALBUMIN 2.6* 2.7*     Basename 07/23/12 0428 07/21/12 2044  WBC 9.9 10.8*  NEUTROABS -- --  HGB 11.7* 12.3*  HCT 36.3* 37.2*  MCV 89.4 89.0  PLT 378 403*    Lab Results  Component Value Date   INR 1.0 02/13/2008    Micro Results: Recent Results (from the past 240 hour(s))  MRSA PCR SCREENING     Status: Normal   Collection Time   07/22/12  5:18 PM      Component Value Range Status Comment   MRSA by PCR NEGATIVE  NEGATIVE Final      Studies/Results: Ct Angio Chest Pe W/cm &/or Wo Cm  07/22/2012  *RADIOLOGY REPORT*  Clinical Data: Hypoxia, productive cough.  CT ANGIOGRAPHY CHEST  Technique:  Multidetector CT imaging of the chest using the standard protocol during bolus administration of intravenous contrast. Multiplanar reconstructed images including MIPs were obtained and reviewed to evaluate the vascular anatomy.  Contrast: 80mL OMNIPAQUE IOHEXOL 350 MG/ML SOLN  Comparison: 02/13/2008 radiograph  Findings: Pulmonary arterial branches are patent.  Heart size upper normal.  There is a small pericardial effusion.  No pleural effusions.  No intrathoracic lymphadenopathy.  Normal caliber aorta and branch vessels.  Limited images through the upper abdomen show no acute finding. Hepatic steatosis suggested.  Central airways are patent. There is a clustered nodularity and peribronchial thickening involving the right upper lobe anteriorly, lingula, middle lobe, and bilateral lower lobe.  Densest area of consolidation is within the left lung base posteriorly.  T4 vertebral body hemangioma.  No acute or aggressive osseous lesion.  IMPRESSION: No pulmonary embolism.  Multifocal areas of consolidation, peribronchial thickening, and clustered nodularity.  Most in keeping with multifocal pneumonia. There may be an underlying process as well.  Therefore, recommend a follow-up chest CT after appropriate therapy to document resolution.  Small pericardial effusion.   Original Report  Authenticated By: Jearld Lesch, M.D.    Dg Chest Port 1 View  07/22/2012  *RADIOLOGY REPORT*  Clinical Data: Pneumonia, increased oxygen requirements  PORTABLE CHEST - 1 VIEW  Comparison: Portable exam 1443 hours compared to 02/13/2008  Findings: Borderline enlargement of cardiac silhouette. Mediastinal contours and pulmonary vascularity normal. Left basilar atelectasis and questionable left perihilar infiltrate. No pleural effusion or pneumothorax. No bones unremarkable.  IMPRESSION: Left basilar atelectasis and questionable left perihilar infiltrate.   Original Report Authenticated By: Ulyses Southward, M.D.      Medications: Scheduled:    . albuterol  2 puff Inhalation TID  . enoxaparin (LOVENOX) injection  40 mg Subcutaneous Q24H  . levothyroxine  100 mcg Oral QAC breakfast  . methylPREDNISolone (SOLU-MEDROL) injection  40 mg Intravenous Q24H  . piperacillin-tazobactam (ZOSYN)  IV  3.375 g Intravenous Q8H  . sodium chloride  3 mL Intravenous Q12H  . sodium chloride  3 mL Intravenous Q12H   Continuous:    Assessment/Plan: Active Problems:  Hypoxia  Pneumonia  Hypothyroid  Clinical and CT picture C/W Multifocal PNA and Hypoxia.  A follow-up chest CT will be performed after appropriate therapy to document resolution.  CXR today not very impressive = perihilar PNA- continue O2, Nebs, Pulm Toilet, time, IV Abx and IV Steroids x 2 days.  Will ask Pulm for help. On D/C he may need outpt O2. Hypothyroid on Rx.   ID -  Anti-infectives     Start     Dose/Rate Route Frequency Ordered Stop   07/22/12 0830   piperacillin-tazobactam (ZOSYN) IVPB 3.375 g        3.375 g 12.5 mL/hr over 240 Minutes Intravenous 3 times per day 07/22/12 0809     07/22/12 0815   piperacillin-tazobactam (ZOSYN) IVPB 3.375 g  Status:  Discontinued        3.375 g 100 mL/hr over 30 Minutes Intravenous 4 times per day 07/22/12 0805 07/22/12 0808   07/22/12 0815   vancomycin (VANCOCIN) IVPB 1000 mg/200 mL premix         1,000 mg 200 mL/hr over 60 Minutes Intravenous  Once 07/22/12 0805 07/22/12 1123   07/21/12 2200   amoxicillin-clavulanate (AUGMENTIN) 875-125 MG per tablet 1 tablet  Status:  Discontinued        1 tablet Oral Every 12 hours 07/21/12 2024 07/22/12 0805         DVT Prophylaxis    LOS: 2 days   Tylyn Stankovich M 07/23/2012, 7:51 AM

## 2012-07-23 NOTE — Progress Notes (Signed)
Utilization Review Completed.Aaron Jackson T12/19/2013   

## 2012-07-24 ENCOUNTER — Inpatient Hospital Stay (HOSPITAL_COMMUNITY): Payer: Managed Care, Other (non HMO)

## 2012-07-24 LAB — CBC
Hemoglobin: 12 g/dL — ABNORMAL LOW (ref 13.0–17.0)
Platelets: 382 10*3/uL (ref 150–400)
RBC: 4.21 MIL/uL — ABNORMAL LOW (ref 4.22–5.81)
WBC: 10.2 10*3/uL (ref 4.0–10.5)

## 2012-07-24 MED ORDER — LEVOFLOXACIN 750 MG PO TABS
750.0000 mg | ORAL_TABLET | Freq: Every day | ORAL | Status: DC
Start: 2012-07-25 — End: 2012-07-25
  Administered 2012-07-25: 750 mg via ORAL
  Filled 2012-07-24: qty 1

## 2012-07-24 MED ORDER — PREDNISONE 20 MG PO TABS
40.0000 mg | ORAL_TABLET | Freq: Every day | ORAL | Status: DC
  Administered 2012-07-24 – 2012-07-25 (×2): 40 mg via ORAL
  Filled 2012-07-24 (×4): qty 2

## 2012-07-24 NOTE — Care Management Note (Unsigned)
    Page 1 of 1   07/24/2012     3:49:04 PM   CARE MANAGEMENT NOTE 07/24/2012  Patient:  Aaron Jackson, Aaron Jackson   Account Number:  [id number]  Date Initiated:  07/24/2012  Documentation initiated by:  GRAVES-BIGELOW,Nayla Dias  Subjective/Objective Assessment:   Pt admitted with bad cough. Treating for PNA with Iv abx changed to po.     Action/Plan:   CM did speak to pt about possible home 02 and he is agreeable if needed. Pt has CIgna and will have to use [employer] for DME 02. RN is aware to ambulat pt and to see if qualifies for home 02. Qualifying sats/order for 02 will need to be faxed.   Anticipated DC Date:  07/26/2012   Anticipated DC Plan:  HOME/SELF CARE      DC Planning Services  CM consult      PAC Choice  DURABLE MEDICAL EQUIPMENT   Choice offered to / List presented to:  C-1 Patient           Status of service:  In process, will continue to follow Medicare Important Message given?   (If response is "NO", the following Medicare IM given date fields will be blank) Date Medicare IM given:   Date Additional Medicare IM given:    Discharge Disposition:    Per UR Regulation:  Reviewed for med. necessity/level of care/duration of stay  If discussed at Long Length of Stay Meetings, dates discussed:    Comments:  07-24-12 [address] Tomi Bamberger, RN,BSN [phone] Christoper Allegra [phone]  [phone]. Weekend CM will fax information  order and qualifying sats to [employer] if pt qualifies for 02 and they will deliver DME to hospital. RN please call weekend CM for assistance. Thanks

## 2012-07-24 NOTE — Progress Notes (Signed)
Subjective: In hospital for Multifocal PNA and Hypoxia. Was able to move bact to tele yesterday Appreciate Pulm eval Feeling well when lstaying still. Tachypnea when moving Cough with min sputum.  Objective: Vital signs in last 24 hours: Temp:  [97.6 F (36.4 C)-98.7 F (37.1 C)] 98.1 F (36.7 C) (12/20 0500) Pulse Rate:  [78-95] 80  (12/20 0500) Resp:  [17-24] 18  (12/20 0500) BP: (109-119)/(65-74) 109/65 mmHg (12/20 0500) SpO2:  [90 %-93 %] 90 % (12/20 0500) Weight change:  Last BM Date: 07/23/12  CBG (last 3)  No results found for this basename: GLUCAP:3 in the last 72 hours  Intake/Output from previous day:  Intake/Output Summary (Last 24 hours) at 07/24/12 0729 Last data filed at 07/24/12 0500  Gross per 24 hour  Intake  187.5 ml  Output   1775 ml  Net -1587.5 ml   12/19 0701 - 12/20 0700 In: 187.5 [IV Piggyback:187.5] Out: 1775 [Urine:1775]   Physical Exam  General appearance: Looks fine in bed wearing Oxygen.  4L Eyes: no scleral icterus Throat: oropharynx moist without erythema Resp: Distant and clear Cardio: Reg GI: soft, non-tender; bowel sounds normal; no masses,  no organomegaly Extremities: no clubbing, cyanosis or edema   Lab Results:  [facility] 07/23/12 0428 07/21/12 2044  NA 139 143  K 4.0 3.9  CL 104 104  CO2 27 29  GLUCOSE 105* 90  BUN 14 16  CREATININE 1.12 1.02  CALCIUM 8.8 8.7  MG -- --  PHOS -- --     Basename 07/23/12 0428 07/21/12 2044  AST 32 37  ALT 72* 72*  ALKPHOS 65 71  BILITOT 0.3 0.4  PROT 6.4 6.5  ALBUMIN 2.6* 2.7*     Basename 07/24/12 0510 07/23/12 0428  WBC 10.2 9.9  NEUTROABS -- --  HGB 12.0* 11.7*  HCT 38.0* 36.3*  MCV 90.3 89.4  PLT 382 378    Lab Results  Component Value Date   INR 1.0 02/13/2008    Micro Results: Recent Results (from the past 240 hour(s))  MRSA PCR SCREENING     Status: Normal   Collection Time   07/22/12  5:18 PM      Component Value Range Status Comment   MRSA by PCR  NEGATIVE  NEGATIVE Final      Studies/Results: Dg Chest 2 View  07/23/2012  *RADIOLOGY REPORT*  Clinical Data: Follow up pneumonia, cough  CHEST - 2 VIEW  Comparison: 07/22/2012  Findings: Cardiomediastinal silhouette is stable.  Again noted left basilar atelectasis or scarring.  Persistent left perihilar streaky infiltrate.  No pulmonary edema.  IMPRESSION:   Again noted left basilar atelectasis or scarring.  Persistent left perihilar streaky infiltrate.  No pulmonary edema.   Original Report Authenticated By: Natasha Mead, M.D.    Dg Chest Port 1 View  07/22/2012  *RADIOLOGY REPORT*  Clinical Data: Pneumonia, increased oxygen requirements  PORTABLE CHEST - 1 VIEW  Comparison: Portable exam 1443 hours compared to 02/13/2008  Findings: Borderline enlargement of cardiac silhouette. Mediastinal contours and pulmonary vascularity normal. Left basilar atelectasis and questionable left perihilar infiltrate. No pleural effusion or pneumothorax. No bones unremarkable.  IMPRESSION: Left basilar atelectasis and questionable left perihilar infiltrate.   Original Report Authenticated By: Ulyses Southward, M.D.      Medications: Scheduled:    . enoxaparin (LOVENOX) injection  40 mg Subcutaneous Q24H  . levofloxacin (LEVAQUIN) IV  750 mg Intravenous Q24H  . levothyroxine  100 mcg Oral QAC breakfast  . piperacillin-tazobactam (ZOSYN)  IV  3.375 g Intravenous Q8H  . sodium chloride  3 mL Intravenous Q12H  . sodium chloride  3 mL Intravenous Q12H   Continuous:    Assessment/Plan: Active Problems:  Hypoxia  Pneumonia  Hypothyroid  Acute respiratory failure with hypoxia  Clinical and CT picture C/W Multifocal PNA and Hypoxia.  Appreciate Pulm eval yesterday A follow-up chest CT may be performed after appropriate therapy to document resolution.  CXRs not very impressive = perihilar PNA - continue O2, Nebs, Pulm Toilet, time,  Abx and Steroids.  On D/C he may need outpt O2. Dr Sung Amabile recommended: -Would  complete 10 days Levofloxacin .Marland KitchenOk to switch to oral after next dose. -Suggest prednisone 60 mg daily tapering to off over 10-14 days. I started 40 as he just had two big doses IV Solumedrol  -Assess home O2 needs prior to discharge (it will only be temporary!)  If RA SpO2 is > 85%, he probably doesn't need O2  -Follow up CXR in a week and again in 2-3 wks.  -If symptoms and/or CXR fail to respond to the above therapies, suggest re-eval by pulmonary medicine as outpt I Ordered case management with the comment "He will need to get set up with Home health to supply Oxygen. He will be going home over the weekend and will need FIO2." See what can be arranged today.  Hypothyroid on Rx.   ID -  Anti-infectives     Start     Dose/Rate Route Frequency Ordered Stop   07/23/12 1000   levofloxacin (LEVAQUIN) IVPB 750 mg        750 mg 100 mL/hr over 90 Minutes Intravenous Every 24 hours 07/23/12 0937     07/22/12 0830  piperacillin-tazobactam (ZOSYN) IVPB 3.375 g       3.375 g 12.5 mL/hr over 240 Minutes Intravenous 3 times per day 07/22/12 0809     07/22/12 0815   piperacillin-tazobactam (ZOSYN) IVPB 3.375 g  Status:  Discontinued        3.375 g 100 mL/hr over 30 Minutes Intravenous 4 times per day 07/22/12 0805 07/22/12 0808   07/22/12 0815   vancomycin (VANCOCIN) IVPB 1000 mg/200 mL premix        1,000 mg 200 mL/hr over 60 Minutes Intravenous  Once 07/22/12 0805 07/22/12 1123   07/21/12 2200   amoxicillin-clavulanate (AUGMENTIN) 875-125 MG per tablet 1 tablet  Status:  Discontinued        1 tablet Oral Every 12 hours 07/21/12 2024 07/22/12 0805         DVT Prophylaxis    LOS: 3 days   Cathalina Barcia M 07/24/2012, 7:29 AM

## 2012-07-25 MED ORDER — LEVOFLOXACIN 750 MG PO TABS: 750.0000 mg | ORAL_TABLET | Freq: Every day | ORAL | Status: DC

## 2012-07-25 NOTE — Discharge Summary (Signed)
DISCHARGE SUMMARY  Aaron Jackson  MR#: [id number]  DOB:1966-04-20  Date of Admission: 07/21/2012 Date of Discharge: 07/25/2012  Attending Physician:Aaron Jackson  Patient's Aaron Jackson  Consults:  none  Discharge Diagnoses: Active Problems:  Hypoxia  Pneumonia  Hypothyroid  Acute respiratory failure with hypoxia   Discharge Medications:   Medication List     As of 07/25/2012 10:21 AM    STOP taking these medications         amoxicillin-clavulanate 875-125 MG per tablet   Commonly known as: AUGMENTIN      magic mouthwash Soln      TAKE these medications         benzonatate 100 MG capsule   Commonly known as: TESSALON   Take 100 mg by mouth 3 (three) times daily as needed.      levofloxacin 750 MG tablet   Commonly known as: LEVAQUIN   Take 1 tablet (750 mg total) by mouth daily.      levothyroxine 100 MCG tablet   Commonly known as: SYNTHROID, LEVOTHROID   Take 100 mcg by mouth daily.      predniSONE 20 MG tablet   Commonly known as: DELTASONE   Take 20 mg by mouth daily. For 4 days. Started 12.13.13 ending 12.17.13      TYLENOL PO   Take 2 tablets by mouth every 4 (four) hours as needed. For headache        Hospital Procedures: Dg Chest 2 View  07/23/2012  *RADIOLOGY REPORT*  Clinical Data: Follow up pneumonia, cough  CHEST - 2 VIEW  Comparison: 07/22/2012  Findings: Cardiomediastinal silhouette is stable.  Again noted left basilar atelectasis or scarring.  Persistent left perihilar streaky infiltrate.  No pulmonary edema.  IMPRESSION:   Again noted left basilar atelectasis or scarring.  Persistent left perihilar streaky infiltrate.  No pulmonary edema.   Original Report Authenticated By: Aaron Jackson, M.D.    Ct Angio Chest Pe W/cm &/or Wo Cm  07/22/2012  *RADIOLOGY REPORT*  Clinical Data: Hypoxia, productive cough.  CT ANGIOGRAPHY CHEST  Technique:  Multidetector CT imaging of the chest using the standard protocol during bolus  administration of intravenous contrast. Multiplanar reconstructed images including MIPs were obtained and reviewed to evaluate the vascular anatomy.  Contrast: 80mL OMNIPAQUE IOHEXOL 350 MG/ML SOLN  Comparison: 02/13/2008 radiograph  Findings: Pulmonary arterial branches are patent.  Heart size upper normal.  There is Jackson small pericardial effusion.  No pleural effusions.  No intrathoracic lymphadenopathy.  Normal caliber aorta and branch vessels.  Limited images through the upper abdomen show no acute finding. Hepatic steatosis suggested.  Central airways are patent. There is Jackson clustered nodularity and peribronchial thickening involving the right upper lobe anteriorly, lingula, middle lobe, and bilateral lower lobe.  Densest area of consolidation is within the left lung base posteriorly.  T4 vertebral body hemangioma.  No acute or aggressive osseous lesion.  IMPRESSION: No pulmonary embolism.  Multifocal areas of consolidation, peribronchial thickening, and clustered nodularity.  Most in keeping with multifocal pneumonia. There may be an underlying process as well.  Therefore, recommend Jackson follow-up chest CT after appropriate therapy to document resolution.  Small pericardial effusion.   Original Report Authenticated By: Aaron Jackson, M.D.    Dg Chest Portable 1 View  07/24/2012  *RADIOLOGY REPORT*  Clinical Data: Cough, shortness of breath  PORTABLE CHEST - 1 VIEW  Comparison: 07/23/2012  Findings: Slight increased bibasilar streaky densities, suspect atelectasis.  Stable heart size and vascularity.  No CHF, effusion, pneumothorax.  Trachea midline.  Improving left hilar aeration.  IMPRESSION: Increased bibasilar atelectasis.   Original Report Authenticated By: Aaron Jackson, M.D.    Dg Chest Port 1 View  07/22/2012  *RADIOLOGY REPORT*  Clinical Data: Pneumonia, increased oxygen requirements  PORTABLE CHEST - 1 VIEW  Comparison: Portable exam 1443 hours compared to 02/13/2008  Findings: Borderline enlargement  of cardiac silhouette. Mediastinal contours and pulmonary vascularity normal. Left basilar atelectasis and questionable left perihilar infiltrate. No pleural effusion or pneumothorax. No bones unremarkable.  IMPRESSION: Left basilar atelectasis and questionable left perihilar infiltrate.   Original Report Authenticated By: Ulyses Southward, M.D.     History of Present Illness: Several days of what looked like to be Jackson viral infection, progressed to sinusitis, WBC 7  Got Augmentin, now on 8th day and just not better, sinuses better, but SOB and just winded if he moves around. Can sit and do desk work but if he gets up and starts walking or has to go up steps, he is out of breath. 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 Now Throat and vocal cord issues. B Maxillary Sinus Pressure. PNgtt. Tm 102.6.. HA. coughing with min sputum, Phlegm sometimes green in color. (-) Wheezing/SOB. Ears are not hurting. Pt feels weak but not achey. Tylenol prn. light sensitivity. He had Fever for 10 days prior to that OV.   Hospital Course: Patient was admitted to the medical service placed on IV antibiotics, IV fluids and bronchodilators. He was also supplemented with oxygen and steroids. He improved dramatically over his hospital course. Was mobilized. Did not qualify for home oxygen. Discharge in improved and stable condition after having been observed on orals for at least 24 hours. Day of Discharge Exam BP 135/61  Pulse 74  Temp 97.5 F (36.4 C) (Oral)  Resp 19  Ht 6\' 3"  (1.905 m)  Wt 114.2 kg (251 lb 12.3 oz)  BMI 31.47 kg/m2  SpO2 92%  Physical Exam: Patient is awake alert no distress ready for discharge. No dyspnea with exertion. No JVD or bruits. Lungs are clear. Cardiovascular exam regular rate and rhythm. Abdomen is benign. No peripheral  edema. Neurologically he is normal.   Discharge Labs:  [facility] 07/23/12 0428  NA 139  K 4.0  CL 104  CO2 27  GLUCOSE 105*  BUN 14  CREATININE 1.12  CALCIUM 8.8  MG --  PHOS --    Basename 07/23/12 0428  AST 32  ALT 72*  ALKPHOS 65  BILITOT 0.3  PROT 6.4  ALBUMIN 2.6*    Basename 07/24/12 0510 07/23/12 0428  WBC 10.2 9.9  NEUTROABS -- --  HGB 12.0* 11.7*  HCT 38.0* 36.3*  MCV 90.3 89.4  PLT 382 378   No results found for this basename: CKTOTAL:3,CKMB:3,CKMBINDEX:3,TROPONINI:3 in the last 72 hours No results found for this basename: TSH,T4TOTAL,FREET3,T3FREE,THYROIDAB in the last 72 hours No results found for this basename: VITAMINB12:2,FOLATE:2,FERRITIN:2,TIBC:2,IRON:2,RETICCTPCT:2 in the last 72 hours  Discharge instructions:     Discharge Orders    Future Orders Please Complete By Expires   Diet - low sodium heart healthy      Increase activity slowly         Disposition: *Home Follow-up Appts: Follow-up with Dr. Jacky Kindle at [facility] in *  Dr. Timothy Lasso one to 2 weeks  Call for appointment.  Condition on Discharge: *Stable improved Tests Needing Follow-up: *Non- Signed: Jontae Adebayo Jackson 07/25/2012, 10:21 AM

## 2012-07-25 NOTE — Progress Notes (Signed)
CARE MANAGEMENT NOTE 07/25/2012  Comments:  07/25/12 )930 Vance Peper, RN ,BSN [phone] Patient doesnt meet qualifications for home oxygen. Sats on ambulation charted by bedside RN. Case Manager signing off.

## 2012-07-25 NOTE — Progress Notes (Signed)
Pt ambulated on room air 500 ft.  Oxygen saturations decreased to 89% and recovered spontaneously to 92% without supplemental oxygen.  Julie-Ann Vanmaanen, [facility]

## 2013-07-05 HISTORY — PX: WISDOM TOOTH EXTRACTION: SHX21

## 2014-06-09 ENCOUNTER — Encounter: Payer: Self-pay | Admitting: Internal Medicine

## 2014-08-08 ENCOUNTER — Ambulatory Visit (AMBULATORY_SURGERY_CENTER): Payer: Managed Care, Other (non HMO) | Admitting: *Deleted

## 2014-08-08 VITALS — Ht 72.0 in | Wt 235.0 lb

## 2014-08-08 DIAGNOSIS — Z1211 Encounter for screening for malignant neoplasm of colon: Secondary | ICD-10-CM

## 2014-08-08 MED ORDER — MOVIPREP 100 G PO SOLR: 1.0000 | Freq: Once | ORAL | Status: DC

## 2014-08-08 NOTE — Progress Notes (Signed)
No egg or soy allergy. ewm No issues with past sedation. ewm No diet pills.ewm emmi video to pt's e mail. ewm

## 2014-08-15 ENCOUNTER — Encounter: Payer: Managed Care, Other (non HMO) | Admitting: Internal Medicine

## 2014-08-31 IMAGING — CR DG CHEST 1V PORT
1 series · 1 of 1 positions shown · non-contrast
Comparison: Portable exam 4000 hours compared to 02/13/2008

CLINICAL DATA: Pneumonia, increased oxygen requirements

PORTABLE CHEST - 1 VIEW

[AP]
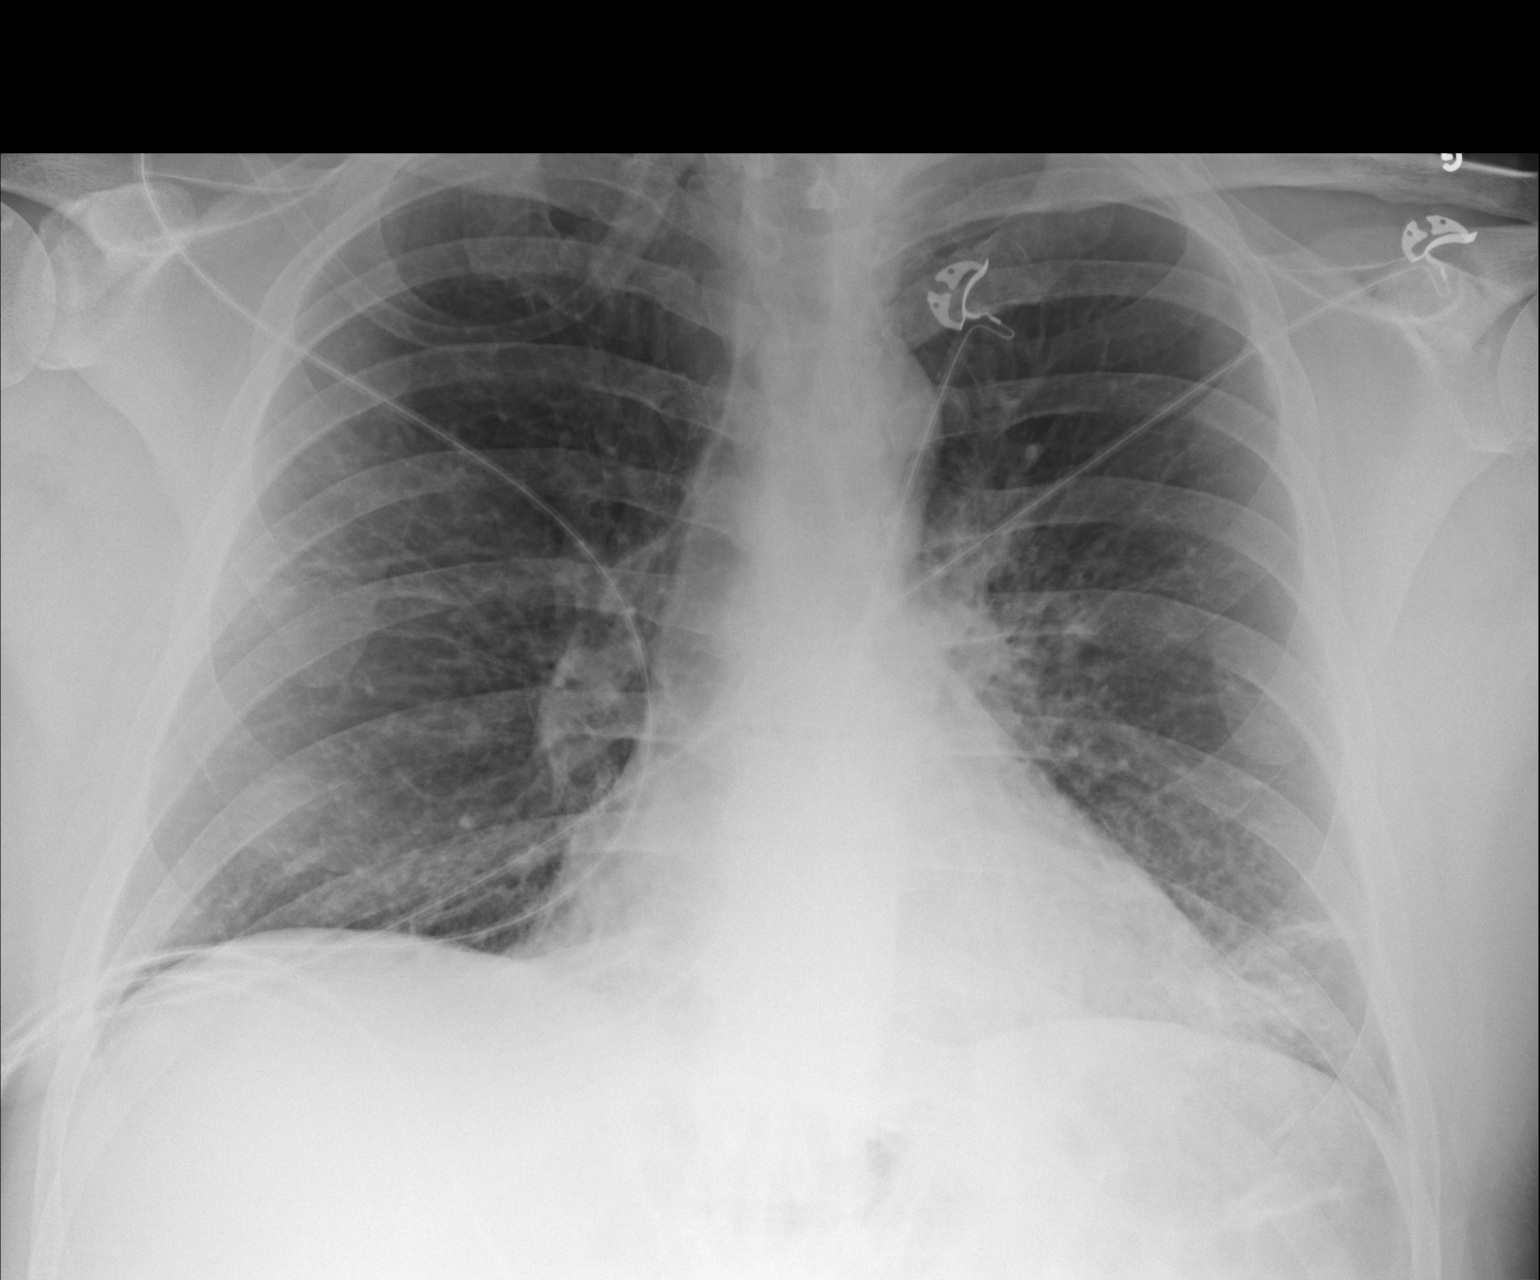

[1 of 1 positions shown; findings below may reference images not displayed]

FINDINGS: Borderline enlargement of cardiac silhouette.
Mediastinal contours and pulmonary vascularity normal.
Left basilar atelectasis and questionable left perihilar
infiltrate.
No pleural effusion or pneumothorax.
No bones unremarkable.
IMPRESSION: Left basilar atelectasis and questionable left perihilar
infiltrate.

## 2014-09-01 IMAGING — CR DG CHEST 2V
2 series · 2 of 2 positions shown · non-contrast
Comparison: 07/22/2012

CLINICAL DATA: Follow up pneumonia, cough

CHEST - 2 VIEW

[w chest pa]
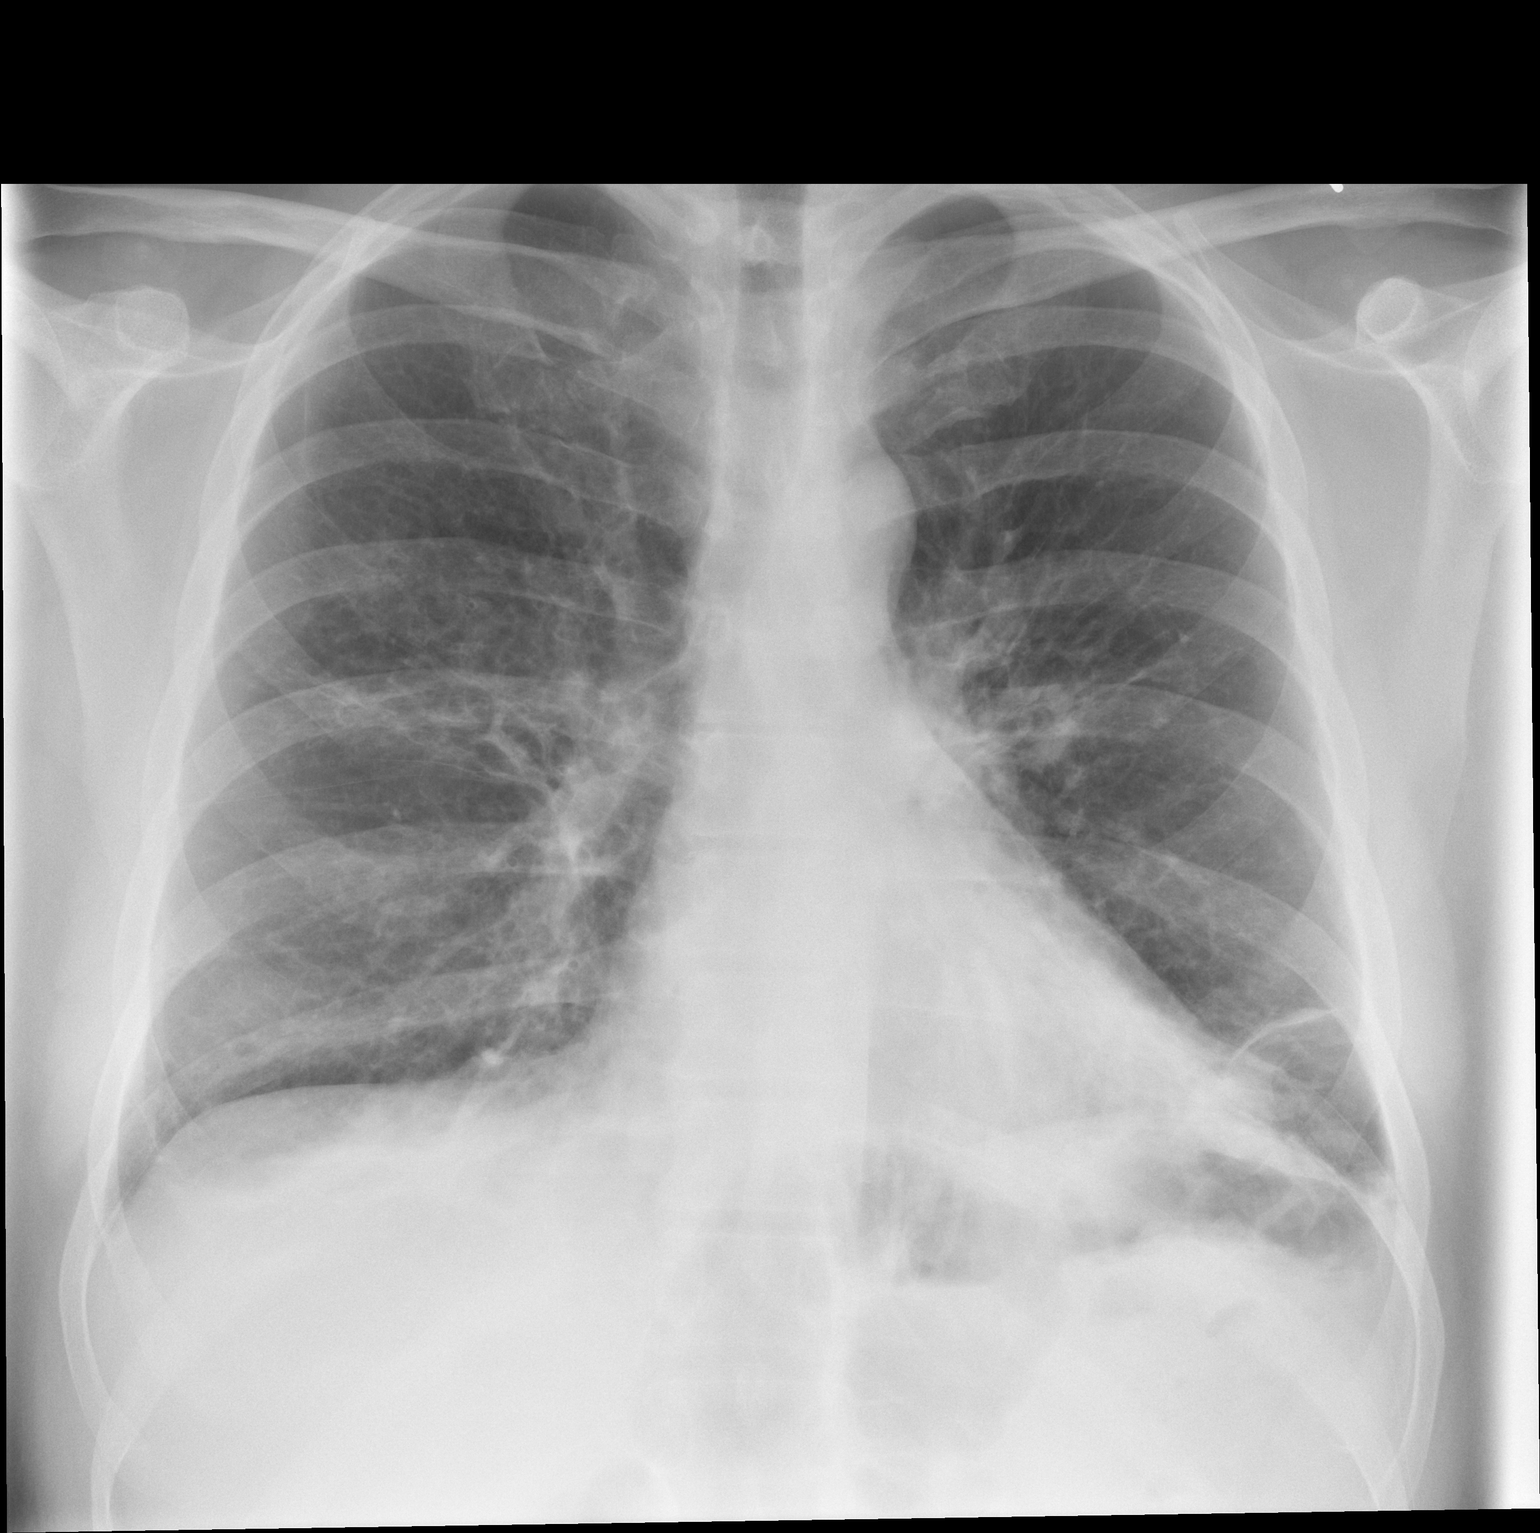

[w chest lat]
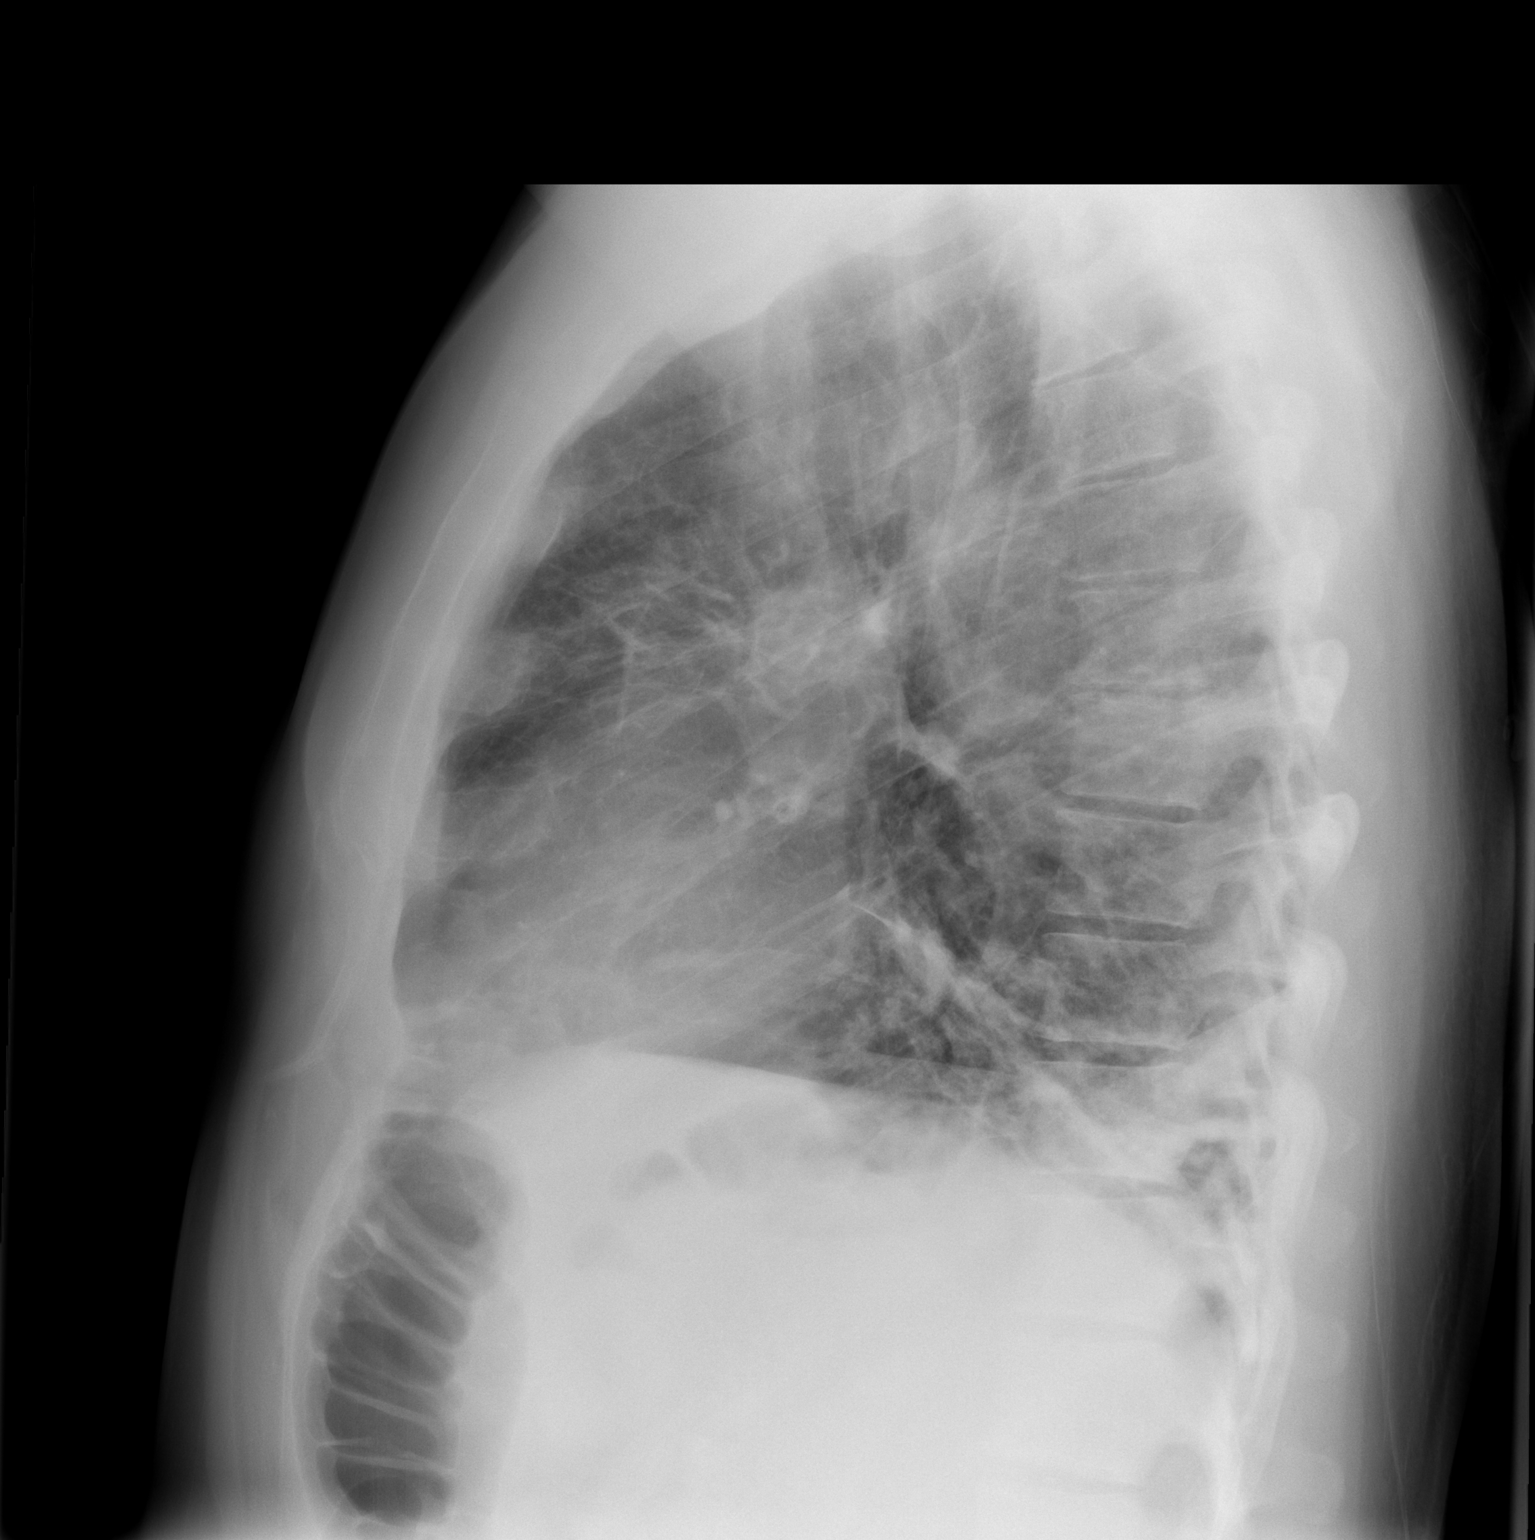

[2 of 2 positions shown; findings below may reference images not displayed]

FINDINGS: Cardiomediastinal silhouette is stable.  Again noted left
basilar atelectasis or scarring.  Persistent left perihilar streaky
infiltrate.  No pulmonary edema.
IMPRESSION: Again noted left basilar atelectasis or scarring.  Persistent
left perihilar streaky infiltrate.  No pulmonary edema.]

## 2014-09-21 ENCOUNTER — Ambulatory Visit (AMBULATORY_SURGERY_CENTER): Payer: Managed Care, Other (non HMO) | Admitting: Internal Medicine

## 2014-09-21 ENCOUNTER — Encounter: Payer: Self-pay | Admitting: Internal Medicine

## 2014-09-21 VITALS — BP 126/78 | HR 82 | Temp 97.5°F | Resp 17 | Ht 72.0 in | Wt 235.0 lb

## 2014-09-21 DIAGNOSIS — Z1211 Encounter for screening for malignant neoplasm of colon: Secondary | ICD-10-CM

## 2014-09-21 MED ORDER — SODIUM CHLORIDE 0.9 % IV SOLN: 500.0000 mL | INTRAVENOUS | Status: DC

## 2014-09-21 NOTE — Op Note (Signed)
[facility]  [address] [place], [id number]   COLONOSCOPY PROCEDURE REPORT  PATIENT: Aaron Jackson, Aaron Jackson  MR#: [id number] BIRTHDATE: [birth year] , 48  yrs. old GENDER: male ENDOSCOPIST: Eustace Quail, MD REFERRED HT:DSKA Virgina Jock, M.D. PROCEDURE DATE:  09/21/2014 PROCEDURE:   Colonoscopy, screening First Screening Colonoscopy - Avg.  risk and is 50 yrs.  old or older Yes.  Prior Negative Screening - Now for repeat screening. N/A  History of Adenoma - Now for follow-up colonoscopy & has been > or = to 3 yrs.  N/A  Polyps Removed Today? No.  Recommend repeat exam, <10 yrs? No. ASA CLASS:   Class II INDICATIONS:average risk patient for colorectal cancer. MEDICATIONS: Monitored anesthesia care and Propofol 400 mg IV  DESCRIPTION OF PROCEDURE:   After the risks benefits and alternatives of the procedure were thoroughly explained, informed consent was obtained.  The digital rectal exam revealed no abnormalities of the rectum.   The LB [id number]  endoscope was introduced through the anus and advanced to the cecum, which was identified by both the appendix and ileocecal valve. No adverse events experienced.   The quality of the prep was good, using MoviPrep  The instrument was then slowly withdrawn as the colon was fully examined.      COLON FINDINGS: A normal appearing cecum, ileocecal valve, and appendiceal orifice were identified.  The ascending, transverse, descending, sigmoid colon, and rectum appeared unremarkable. Retroflexed views revealed internal hemorrhoids. The time to cecum=7 minutes 19 seconds.  Withdrawal time=13 minutes 38 seconds. The scope was withdrawn and the procedure completed.  COMPLICATIONS: There were no immediate complications.  ENDOSCOPIC IMPRESSION: 1. Normal colonoscopy  RECOMMENDATIONS: 1. Continue current colorectal screening recommendations for "routine risk" patients with a repeat colonoscopy in 10 years.  eSigned:  Eustace Quail, MD 09/21/2014 10:55 AM   cc: Shon Baton, MD and The Patient

## 2014-09-21 NOTE — Patient Instructions (Signed)
Impressions/recommendations:  Normal colonoscopy  Repeat colonoscopy in 10 years.  YOU HAD AN ENDOSCOPIC PROCEDURE TODAY AT THE [facility]: Refer to the procedure report that was given to you for any specific questions about what was found during the examination.  If the procedure report does not answer your questions, please call your gastroenterologist to clarify.  If you requested that your care partner not be given the details of your procedure findings, then the procedure report has been included in a sealed envelope for you to review at your convenience later.  YOU SHOULD EXPECT: Some feelings of bloating in the abdomen. Passage of more gas than usual.  Walking can help get rid of the air that was put into your GI tract during the procedure and reduce the bloating. If you had a lower endoscopy (such as a colonoscopy or flexible sigmoidoscopy) you may notice spotting of blood in your stool or on the toilet paper. If you underwent a bowel prep for your procedure, then you may not have a normal bowel movement for a few days.  DIET: Your first meal following the procedure should be a light meal and then it is ok to progress to your normal diet.  A half-sandwich or bowl of soup is an example of a good first meal.  Heavy or fried foods are harder to digest and may make you feel nauseous or bloated.  Likewise meals heavy in dairy and vegetables can cause extra gas to form and this can also increase the bloating.  Drink plenty of fluids but you should avoid alcoholic beverages for 24 hours.  ACTIVITY: Your care partner should take you home directly after the procedure.  You should plan to take it easy, moving slowly for the rest of the day.  You can resume normal activity the day after the procedure however you should NOT DRIVE or use heavy machinery for 24 hours (because of the sedation medicines used during the test).    SYMPTOMS TO REPORT IMMEDIATELY: A gastroenterologist can be reached  at any hour.  During normal business hours, 8:30 AM to 5:00 PM Monday through Friday, call [phone].  After hours and on weekends, please call the GI answering service at [phone] who will take a message and have the physician on call contact you.   Following lower endoscopy (colonoscopy or flexible sigmoidoscopy):  Excessive amounts of blood in the stool  Significant tenderness or worsening of abdominal pains  Swelling of the abdomen that is new, acute  Fever of 100F or higher   FOLLOW UP: If any biopsies were taken you will be contacted by phone or by letter within the next 1-3 weeks.  Call your gastroenterologist if you have not heard about the biopsies in 3 weeks.  Our staff will call the home number listed on your records the next business day following your procedure to check on you and address any questions or concerns that you may have at that time regarding the information given to you following your procedure. This is a courtesy call and so if there is no answer at the home number and we have not heard from you through the emergency physician on call, we will assume that you have returned to your regular daily activities without incident.  SIGNATURES/CONFIDENTIALITY: You and/or your care partner have signed paperwork which will be entered into your electronic medical record.  These signatures attest to the fact that that the information above on your After Visit Summary has been   and is understood.  Full responsibility of the confidentiality of this discharge information lies with you and/or your care-partner. 

## 2014-09-21 NOTE — Progress Notes (Signed)
A/ox3 pleased with MAC, report to Evelina Dun RN

## 2014-09-22 ENCOUNTER — Telehealth: Payer: Self-pay | Admitting: *Deleted

## 2014-09-22 NOTE — Telephone Encounter (Signed)
  Follow up Call-  Call back number 09/21/2014  Post procedure Call Back phone  # [phone]  Permission to leave phone message Yes     Patient questions:  Do you have a fever, pain , or abdominal swelling? No. Pain Score  0 *  Have you tolerated food without any problems? Yes.    Have you been able to return to your normal activities? Yes.    Do you have any questions about your discharge instructions: Diet   No. Medications  No. Follow up visit  No.  Do you have questions or concerns about your Care? No.  Actions: * If pain score is 4 or above: No action needed, pain <4.

## 2020-11-09 ENCOUNTER — Encounter: Payer: Self-pay | Admitting: Internal Medicine

## 2021-01-09 ENCOUNTER — Encounter: Payer: Self-pay | Admitting: Internal Medicine

## 2021-01-09 ENCOUNTER — Ambulatory Visit (INDEPENDENT_AMBULATORY_CARE_PROVIDER_SITE_OTHER): Payer: No Typology Code available for payment source | Admitting: Internal Medicine

## 2021-01-09 VITALS — BP 136/70 | HR 98 | Ht 75.0 in | Wt 279.0 lb

## 2021-01-09 DIAGNOSIS — R195 Other fecal abnormalities: Secondary | ICD-10-CM

## 2021-01-09 DIAGNOSIS — K219 Gastro-esophageal reflux disease without esophagitis: Secondary | ICD-10-CM | POA: Diagnosis not present

## 2021-01-09 DIAGNOSIS — R1319 Other dysphagia: Secondary | ICD-10-CM | POA: Diagnosis not present

## 2021-01-09 MED ORDER — PANTOPRAZOLE SODIUM 40 MG PO TBEC: 40.0000 mg | DELAYED_RELEASE_TABLET | Freq: Every day | ORAL | 3 refills | Status: DC

## 2021-01-09 MED ORDER — SUTAB 1479-225-188 MG PO TABS: 1.0000 | ORAL_TABLET | Freq: Once | ORAL | 0 refills | Status: AC

## 2021-01-09 NOTE — Patient Instructions (Signed)
If you are age 57 or older, your body mass index should be between 23-30. Your Body mass index is 34.87 kg/m. If this is out of the aforementioned range listed, please consider follow up with your Primary Care Provider.  If you are age 30 or younger, your body mass index should be between 19-25. Your Body mass index is 34.87 kg/m. If this is out of the aformentioned range listed, please consider follow up with your Primary Care Provider.   __________________________________________________________  The [facility] providers would like to encourage you to use [facility] to communicate with providers for non-urgent requests or questions.  Due to long hold times on the telephone, sending your provider a message by [facility] may be a faster and more efficient way to get a response.  Please allow 48 business hours for a response.  Please remember that this is for non-urgent requests.   We have sent the following medications to your pharmacy for you to pick up at your convenience:  Protonix  You have been scheduled for an endoscopy and colonoscopy. Please follow the written instructions given to you at your visit today. Please pick up your prep supplies at the pharmacy within the next 1-3 days. If you use inhalers (even only as needed), please bring them with you on the day of your procedure.

## 2021-01-09 NOTE — Progress Notes (Signed)
HISTORY OF PRESENT ILLNESS:  Aaron Jackson is a 55 y.o. 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 Virgina Jock regarding Hemoccult positive stool.  I have reviewed outside office note and laboratories.  Did undergo routine screening colonoscopy September 21, 2014.  Examination was normal.  As part of his routine annual evaluation he submitted Hemoccult studies which were positive.  Review of laboratories finds normal CBC with hemoglobin 15.6.  Positive Hemoccult studies as noted.  Normal comprehensive metabolic panel except for minimally elevated ALT 42.  Patient tells me that over the past year he has developed reflux symptoms.  Particularly noticeable at night with pyrosis and regurgitation.  Over the past year or so he has gained 25 pounds related to [place] sedentary lifestyle.  He also reports longstanding intermittent solid food dysphagia which has accelerated over the past year.  He states he needs to be very careful with foods of substance, such as hamburger.  He denies any lower GI complaints.  No melena or hematochezia.  No family history of colon cancer.  He has been vaccinated against COVID  REVIEW OF SYSTEMS:  All non-GI ROS negative unless otherwise stated in the HPI.  Past Medical History:  Diagnosis Date  . Gout   . Hypogonadism in male   . Hypothyroidism   . Obesity   . Plantar fasciitis   . Pneumonia 07/2012    Past Surgical History:  Procedure Laterality Date  . COLONOSCOPY    . perineal abscess I & D    . WISDOM TOOTH EXTRACTION  07/2013    Social History Aldean Pipe  reports that he has never smoked. He has never used smokeless tobacco. He reports current alcohol use of about 2.0 standard drinks of alcohol per week. He reports that he does not use drugs.  family history includes Congestive Heart Failure in his mother; Lung cancer in his father.  No Known Allergies      PHYSICAL EXAMINATION: Vital signs: BP 136/70   Pulse 98   Ht 6\' 3"  (1.905 m)   Wt 279 lb (126.6 kg)   BMI 34.87 kg/m   Constitutional: generally well-appearing, no acute distress Psychiatric: alert and oriented x3, cooperative Eyes: extraocular movements intact, anicteric, conjunctiva pink Mouth: oral pharynx moist, no lesions Neck: supple no lymphadenopathy Cardiovascular: heart regular rate and rhythm, no murmur Lungs: clear to auscultation bilaterally Abdomen: soft, nontender, nondistended, no obvious ascites, no peritoneal signs, normal bowel sounds, no organomegaly Rectal: Deferred until colonoscopy Extremities: no clubbing, cyanosis, or lower extremity edema bilaterally Skin: no lesions on visible extremities Neuro: No focal deficits.  Cranial nerves intact  ASSESSMENT:  1.  Hemoccult positive stool.  Rule out underlying GI mucosal abnormality 2.  GERD.  Symptoms significant over the past year.  Related to weight gain 3.  Worsening intermittent solid food dysphagia.  Rule out peptic stricture 4.  Normal colonoscopy 2016   PLAN:  1.  Reflux precautions with attention to weight loss 2.  Prescribe pantoprazole 40 mg daily.  Medication effects and risks reviewed. 3.  Schedule upper endoscopy with possible esophageal dilation to evaluate chronic GERD, Hemoccult-positive stool, and dysphagia.The nature of the procedure, as well as the risks, benefits, and alternatives were carefully and thoroughly reviewed with the patient. Ample time for discussion and questions allowed. The patient understood, was satisfied, and agreed to proceed. 4.  Schedule colonoscopy to evaluate Hemoccult-positive stool.The  nature of the procedure, as well as the risks, benefits, and alternatives were carefully and thoroughly reviewed with the patient. Ample time for discussion and questions allowed. The patient understood, was satisfied, and agreed to proceed. 5.  Ongoing general medical care with Dr.  Virgina Jock

## 2021-03-12 ENCOUNTER — Encounter: Payer: Self-pay | Admitting: Internal Medicine

## 2021-03-20 ENCOUNTER — Ambulatory Visit (AMBULATORY_SURGERY_CENTER): Payer: No Typology Code available for payment source | Admitting: Internal Medicine

## 2021-03-20 ENCOUNTER — Encounter: Payer: Self-pay | Admitting: Internal Medicine

## 2021-03-20 VITALS — BP 128/83 | HR 88 | Temp 97.7°F | Resp 14 | Ht 75.0 in | Wt 279.0 lb

## 2021-03-20 DIAGNOSIS — K219 Gastro-esophageal reflux disease without esophagitis: Secondary | ICD-10-CM | POA: Diagnosis not present

## 2021-03-20 DIAGNOSIS — R1319 Other dysphagia: Secondary | ICD-10-CM | POA: Diagnosis not present

## 2021-03-20 DIAGNOSIS — K449 Diaphragmatic hernia without obstruction or gangrene: Secondary | ICD-10-CM

## 2021-03-20 DIAGNOSIS — R195 Other fecal abnormalities: Secondary | ICD-10-CM | POA: Diagnosis not present

## 2021-03-20 DIAGNOSIS — K635 Polyp of colon: Secondary | ICD-10-CM

## 2021-03-20 DIAGNOSIS — K317 Polyp of stomach and duodenum: Secondary | ICD-10-CM | POA: Diagnosis not present

## 2021-03-20 DIAGNOSIS — D122 Benign neoplasm of ascending colon: Secondary | ICD-10-CM

## 2021-03-20 DIAGNOSIS — K222 Esophageal obstruction: Secondary | ICD-10-CM

## 2021-03-20 MED ORDER — SODIUM CHLORIDE 0.9 % IV SOLN: 500.0000 mL | Freq: Once | INTRAVENOUS | Status: DC

## 2021-03-20 NOTE — Progress Notes (Signed)
Called to room to assist during endoscopic procedure.  Patient ID and intended procedure confirmed with present staff. Received instructions for my participation in the procedure from the performing physician.  

## 2021-03-20 NOTE — Progress Notes (Signed)
Report to PACU, RN, vss, BBS= Clear.  

## 2021-03-20 NOTE — Patient Instructions (Addendum)
2 polyps removed- please read over polyp handout; await pathology.  Next colonoscopy-5 years  Continue your normal medications- take previously prescribed Pantoprazole 30 mg daily- remember to take every day!  Follow dilation diet today!!!!  See handout!  YOU HAD AN ENDOSCOPIC PROCEDURE TODAY AT [facility]:   Refer to the procedure report that was given to you for any specific questions about what was found during the examination.  If the procedure report does not answer your questions, please call your gastroenterologist to clarify.  If you requested that your care partner not be given the details of your procedure findings, then the procedure report has been included in a sealed envelope for you to review at your convenience later.  YOU SHOULD EXPECT: Some feelings of bloating in the abdomen. Passage of more gas than usual.  Walking can help get rid of the air that was put into your GI tract during the procedure and reduce the bloating. If you had a lower endoscopy (such as a colonoscopy or flexible sigmoidoscopy) you may notice spotting of blood in your stool or on the toilet paper. If you underwent a bowel prep for your procedure, you may not have a normal bowel movement for a few days.  Please Note:  You might notice some irritation and congestion in your nose or some drainage.  This is from the oxygen used during your procedure.  There is no need for concern and it should clear up in a day or so.  SYMPTOMS TO REPORT IMMEDIATELY:  Following lower endoscopy (colonoscopy or flexible sigmoidoscopy):  Excessive amounts of blood in the stool  Significant tenderness or worsening of abdominal pains  Swelling of the abdomen that is new, acute  Fever of 100F or higher  Following upper endoscopy (EGD)  Vomiting of blood or coffee ground material  New chest pain or pain under the shoulder blades  Painful or persistently difficult swallowing  New shortness of breath  Fever of  100F or higher  Black, tarry-looking stools  For urgent or emergent issues, a gastroenterologist can be reached at any hour by calling [phone]. Do not use MyChart messaging for urgent concerns.    DIET:  follow dilation diet- see above!!! Drink plenty of fluids but you should avoid alcoholic beverages for 24 hours.  ACTIVITY:  You should plan to take it easy for the rest of today and you should NOT DRIVE or use heavy machinery until tomorrow (because of the sedation medicines used during the test).    FOLLOW UP: Our staff will call the number listed on your records 48-72 hours following your procedure to check on you and address any questions or concerns that you may have regarding the information given to you following your procedure. If we do not reach you, we will leave a message.  We will attempt to reach you two times.  During this call, we will ask if you have developed any symptoms of COVID 19. If you develop any symptoms (ie: fever, flu-like symptoms, shortness of breath, cough etc.) before then, please call [phone].  If you test positive for Covid 19 in the 2 weeks post procedure, please call and report this information to [place].    If any biopsies were taken you will be contacted by phone or by letter within the next 1-3 weeks.  Please call us at [phone] if you have not heard about the biopsies in 3 weeks.    SIGNATURES/CONFIDENTIALITY: You and/or your care partner  have signed paperwork which will be entered into your electronic medical record.  These signatures attest to the fact that that the information above on your After Visit Summary has been reviewed and is understood.  Full responsibility of the confidentiality of this discharge information lies with you and/or your care-partner.

## 2021-03-20 NOTE — Progress Notes (Signed)
GI PREPROCEDURE NOTE  The patient presents to the endoscopy center today for colonoscopy and upper endoscopy to evaluate Hemoccult-positive stool, chronic GERD, and dysphagia.  He was thoroughly evaluated in the office January 09, 2021.  See that complete H&P for details.  There have been no interval clinical changes in his health status or meaningful differences on physical exam.  The preprocedure MD charting has also been completed.  Docia Chuck. Geri Seminole., M.D. [facility] Division of Gastroenterology

## 2021-03-20 NOTE — Progress Notes (Signed)
VS taken by DT 

## 2021-03-20 NOTE — Op Note (Signed)
[place] Patient Name: Aaron Jackson Procedure Date: 03/20/2021 3:27 PM MRN: [id number] Endoscopist: Aaron Jackson , MD Age: 55 Referring MD:  Date of Birth: Sep 10, 1965 Gender: Male Account #: [id number] Procedure:                Upper GI endoscopy with biopsies; balloon dilation                            of the esophagus?"20 mm Indications:              Dysphagia, Esophageal reflux Medicines:                Monitored Anesthesia Care Procedure:                Pre-Anesthesia Assessment:                           - Prior to the procedure, a History and Physical                            was performed, and patient medications and                            allergies were reviewed. The patient's tolerance of                            previous anesthesia was also reviewed. The risks                            and benefits of the procedure and the sedation                            options and risks were discussed with the patient.                            All questions were answered, and informed consent                            was obtained. Prior Anticoagulants: The patient has                            taken no previous anticoagulant or antiplatelet                            agents. ASA Grade Assessment: II - A patient with                            mild systemic disease. After reviewing the risks                            and benefits, the patient was deemed in                            satisfactory condition to undergo the procedure.  After obtaining informed consent, the endoscope was                            passed under direct vision. Throughout the                            procedure, the patient's blood pressure, pulse, and                            oxygen saturations were monitored continuously. The                            Endoscope was introduced through the mouth, and                            advanced to the second  part of duodenum. The upper                            GI endoscopy was accomplished without difficulty.                            The patient tolerated the procedure well. Scope In: Scope Out: Findings:                 Acute reflux esophagitis was found in the region of                            the gastroesophageal junction. This was marked by                            friability and edema. No Barrett's esophagus.                           One benign-appearing, intrinsic moderate stenosis                            was found 40 cm from the incisors. This stenosis                            measured 1.5 cm (inner diameter). The stenosis was                            traversed. After completing the endoscopic survey,                            a TTS dilator was passed through the scope.                            Dilation with an 18-19-20 mm balloon dilator was                            performed to 20 mm. Post dilation stricture  disruption noted.                           The stomach revealed a small sliding hiatal hernia.                            In addition multiple small benign fundic gland type                            polyps. Biopsies were taken with a cold forceps for                            histology to confirm the nature of the polyps.                            Stomach was otherwise normal                           The examined duodenum was normal.                           The cardia and gastric fundus were normal on                            retroflexion. Complications:            No immediate complications. Estimated Blood Loss:     Estimated blood loss: none. Impression:               1. GERD complicated by erosive esophagitis and                            peptic stricture                           2. Status post esophageal dilation                           3. Diminutive gastric polyp status post biopsy                           4.  Otherwise unremarkable EGD Recommendation:           1. Patient has a contact number available for                            emergencies. The signs and symptoms of potential                            delayed complications were discussed with the                            patient. Return to normal activities tomorrow.                            Written discharge instructions were provided to the  patient.                           2. Post dilation diet.                           3. Please begin taking the previously prescribed                            pantoprazole 40 mg daily. Please take every day.                           4. Await pathology results.                           5. Office follow-up with Dr. Henrene Jackson in 6 to 8 weeks Aaron Chuck. Henrene Pastor, MD 03/20/2021 4:32:54 PM This report has been signed electronically.

## 2021-03-20 NOTE — Op Note (Signed)
[place] Patient Name: Aaron Jackson Procedure Date: 03/20/2021 3:27 PM MRN: [id number] Endoscopist: Docia Chuck. Henrene Pastor , MD Age: 55 Referring MD:  Date of Birth: 01-Sep-1965 Gender: Male Account #: [id number] Procedure:                Colonoscopy with cold snare polypectomy x 2 Indications:              Heme positive stool. Previous colonoscopy 2016 was                            normal Medicines:                Monitored Anesthesia Care Procedure:                Pre-Anesthesia Assessment:                           - Prior to the procedure, a History and Physical                            was performed, and patient medications and                            allergies were reviewed. The patient's tolerance of                            previous anesthesia was also reviewed. The risks                            and benefits of the procedure and the sedation                            options and risks were discussed with the patient.                            All questions were answered, and informed consent                            was obtained. Prior Anticoagulants: The patient has                            taken no previous anticoagulant or antiplatelet                            agents. ASA Grade Assessment: II - A patient with                            mild systemic disease. After reviewing the risks                            and benefits, the patient was deemed in                            satisfactory condition to undergo the procedure.  After obtaining informed consent, the colonoscope                            was passed under direct vision. Throughout the                            procedure, the patient's blood pressure, pulse, and                            oxygen saturations were monitored continuously. The                            CF [id number] was introduced through the anus                            and advanced to the  the cecum, identified by                            appendiceal orifice and ileocecal valve. The                            ileocecal valve, appendiceal orifice, and rectum                            were photographed. The quality of the bowel                            preparation was excellent. The colonoscopy was                            performed without difficulty. The patient tolerated                            the procedure well. The bowel preparation used was                            SUPREP via split dose instruction. Scope In: 3:42:04 PM Scope Out: 4:01:59 PM Scope Withdrawal Time: 0 hours 15 minutes 0 seconds  Total Procedure Duration: 0 hours 19 minutes 55 seconds  Findings:                 Two polyps were found in the ascending colon. The                            polyps were 2 to 6 mm in size. These polyps were                            removed with a cold snare. Resection and retrieval                            were complete.                           The exam was otherwise without abnormality on  direct and retroflexion views. Complications:            No immediate complications. Estimated blood loss:                            None. Estimated Blood Loss:     Estimated blood loss: none. Impression:               - Two 2 to 6 mm polyps in the ascending colon,                            removed with a cold snare. Resected and retrieved.                           - The examination was otherwise normal on direct                            and retroflexion views. Recommendation:           - Repeat colonoscopy in 5 years for surveillance.                           - Patient has a contact number available for                            emergencies. The signs and symptoms of potential                            delayed complications were discussed with the                            patient. Return to normal activities tomorrow.                             Written discharge instructions were provided to the                            patient.                           - Resume previous diet.                           - Continue present medications.                           - Await pathology results. Docia Chuck. Henrene Pastor, MD 03/20/2021 4:08:22 PM This report has been signed electronically.

## 2021-03-21 ENCOUNTER — Telehealth: Payer: Self-pay

## 2021-03-21 NOTE — Telephone Encounter (Signed)
Follow Up appointment for pt in 6-8 weeks. Scheduling to contact pt for date and time of appointment.

## 2021-03-22 ENCOUNTER — Telehealth: Payer: Self-pay

## 2021-03-22 NOTE — Telephone Encounter (Signed)
  Follow up Call-  Call back number 03/20/2021  Post procedure Call Back phone  # [phone]  Permission to leave phone message Yes  Some recent data might be hidden     Patient questions:  Do you have a fever, pain , or abdominal swelling? No. Pain Score  0 *  Have you tolerated food without any problems? Yes.    Have you been able to return to your normal activities? Yes.    Do you have any questions about your discharge instructions: Diet   No. Medications  No. Follow up visit  No.  Do you have questions or concerns about your Care? No.  Actions: * If pain score is 4 or above: No action needed, pain <4.  Have you developed a fever since your procedure? no  2.   Have you had an respiratory symptoms (SOB or cough) since your procedure? no  3.   Have you tested positive for COVID 19 since your procedure no  4.   Have you had any family members/close contacts diagnosed with the COVID 19 since your procedure?  no   If yes to any of these questions please route to Joylene John, RN and Joella Prince, RN

## 2021-03-22 NOTE — Telephone Encounter (Signed)
First attempt follow up call to pt, no answer, we will call them back later this morning or early this afternoon.

## 2021-03-28 ENCOUNTER — Encounter: Payer: Self-pay | Admitting: Internal Medicine

## 2021-05-02 ENCOUNTER — Ambulatory Visit (INDEPENDENT_AMBULATORY_CARE_PROVIDER_SITE_OTHER): Payer: No Typology Code available for payment source | Admitting: Internal Medicine

## 2021-05-02 ENCOUNTER — Encounter: Payer: Self-pay | Admitting: Internal Medicine

## 2021-05-02 VITALS — BP 132/86 | HR 95 | Ht 75.0 in | Wt 282.6 lb

## 2021-05-02 DIAGNOSIS — K222 Esophageal obstruction: Secondary | ICD-10-CM

## 2021-05-02 DIAGNOSIS — R1319 Other dysphagia: Secondary | ICD-10-CM

## 2021-05-02 DIAGNOSIS — K317 Polyp of stomach and duodenum: Secondary | ICD-10-CM

## 2021-05-02 DIAGNOSIS — D122 Benign neoplasm of ascending colon: Secondary | ICD-10-CM

## 2021-05-02 DIAGNOSIS — K219 Gastro-esophageal reflux disease without esophagitis: Secondary | ICD-10-CM | POA: Diagnosis not present

## 2021-05-02 MED ORDER — PANTOPRAZOLE SODIUM 40 MG PO TBEC: 40.0000 mg | DELAYED_RELEASE_TABLET | Freq: Every day | ORAL | 3 refills | Status: DC

## 2021-05-02 NOTE — Patient Instructions (Signed)
If you are age 57 or older, your body mass index should be between 23-30. Your Body mass index is 35.32 kg/m. If this is out of the aforementioned range listed, please consider follow up with your Primary Care Provider.  If you are age 72 or younger, your body mass index should be between 19-25. Your Body mass index is 35.32 kg/m. If this is out of the aformentioned range listed, please consider follow up with your Primary Care Provider.   __________________________________________________________  The [facility] providers would like to encourage you to use [facility] to communicate with providers for non-urgent requests or questions.  Due to long hold times on the telephone, sending your provider a message by [facility] may be a faster and more efficient way to get a response.  Please allow 48 business hours for a response.  Please remember that this is for non-urgent requests.   We have sent the following medications to your pharmacy for you to pick up at your convenience:  Protonix  Please follow up in one year

## 2021-05-02 NOTE — Progress Notes (Signed)
HISTORY OF PRESENT ILLNESS:  Aaron Jackson is a 55 y.o. 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  See that dictation for details.  He was prescribed pantoprazole 40 mg daily but did not start the medication.  He subsequently underwent colonoscopy and upper endoscopy March 20, 2021.  Colonoscopy revealed 2 sessile serrated polyps but was otherwise normal.  Follow-up in 5 years recommended.  Upper endoscopy revealed erosive esophagitis, peptic stricture, and benign fundic gland polyps (biopsy-proven).  The esophagus was balloon dilated to a maximal diameter of 20 mm.  He was instructed to initiate previously prescribed pantoprazole 40 mg daily.  He did.  He follows up today as requested.  The patient reports absolutely no reflux symptoms.  No recurrent dysphagia.  No appreciable medication side effects.  He is pleased.  No new complaints.  He has multiple appropriate questions  REVIEW OF SYSTEMS:  All non-GI ROS negative entirely.  Past Medical History:  Diagnosis Date   Gout    Hypogonadism in male    Hypothyroidism    Obesity    Plantar fasciitis    Pneumonia 07/2012    Past Surgical History:  Procedure Laterality Date   COLONOSCOPY     perineal abscess I & D     WISDOM TOOTH EXTRACTION  07/2013    Social History Aaron Jackson  reports that he has never smoked. He has never used smokeless tobacco. He reports current alcohol use of about 2.0 standard drinks per week. He reports that he does not use drugs.  family history includes Congestive Heart Failure in his mother; Lung cancer in his father.  No Known Allergies     PHYSICAL EXAMINATION: Vital signs: BP 132/86   Pulse 95   Ht 6\' 3"  (1.905 m)   Wt 282 lb 9.6 oz (128.2 kg)   BMI 35.32 kg/m   Constitutional: generally well-appearing, no acute distress Psychiatric: alert and oriented x3,  cooperative Eyes: Anicteric Mouth: Mask Abdomen: Not reexamined Skin: Normal hue Neuro: No gross deficit  ASSESSMENT:  1.  GERD complicated by erosive esophagitis and peptic stricture.  Asymptomatic post dilation on PPI.  Long discussion today on GERD, its pathophysiology, complications, treatment, and long-term management. 2.  Sessile serrated polyps on colonoscopy   PLAN:  1.  Reflux precautions 2.  Prescribe pantoprazole 40 mg daily.  1 year of refills.  Medication risks reviewed 3.  Routine office follow-up 1 year.  Contact the office in the interim for any questions or problems such as recurrent dysphagia 4.  Surveillance colonoscopy 5 years

## 2021-06-20 ENCOUNTER — Telehealth: Payer: Self-pay | Admitting: Internal Medicine

## 2021-06-20 NOTE — Telephone Encounter (Signed)
Patient called stating his prescription for Pantoprazole went up because of the way it was ordered.  The first few times he refilled it, it was $5, but the last time, the insurance wouldn't cover it.  Please call patient and discuss.  Thank you.

## 2021-06-22 NOTE — Telephone Encounter (Signed)
Pantoprazole needs a PA.  Submitted to Cover My Meds.  Spoke with patient and told him I would let him know when I had an answer

## 2021-06-26 NOTE — Telephone Encounter (Signed)
Answered some more questions from Cover My Meds regarding this PA and submitted again.

## 2021-07-18 NOTE — Telephone Encounter (Signed)
Faxed another questionnaire with more questions

## 2021-07-19 MED ORDER — PANTOPRAZOLE SODIUM 40 MG PO TBEC: 40.0000 mg | DELAYED_RELEASE_TABLET | Freq: Every day | ORAL | 3 refills | Status: DC

## 2021-07-19 NOTE — Telephone Encounter (Signed)
Approved through cover my meds.  Resent rx to pharmacy with approval information

## 2022-07-17 ENCOUNTER — Other Ambulatory Visit: Payer: Self-pay | Admitting: Internal Medicine

## 2022-08-23 ENCOUNTER — Other Ambulatory Visit (HOSPITAL_COMMUNITY): Payer: Self-pay

## 2022-09-11 ENCOUNTER — Telehealth: Payer: Self-pay | Admitting: Pharmacy Technician

## 2022-09-11 NOTE — Telephone Encounter (Signed)
Patient Advocate Encounter  Received notification from [facility] that prior authorization for PANTOPRAZOLE '40MG'$  is required.   PA submitted on 2.7.24 Key [id number] Status is pending

## 2022-10-01 ENCOUNTER — Other Ambulatory Visit (HOSPITAL_COMMUNITY): Payer: Self-pay

## 2022-10-01 NOTE — Telephone Encounter (Signed)
Patient Advocate Encounter  Received notification from [facility] that prior authorization for PANTOPRAZOLE '40MG'$  is required.   PA submitted on 2.27.24 Key [id number] Status is pending

## 2022-10-16 ENCOUNTER — Other Ambulatory Visit (HOSPITAL_COMMUNITY): Payer: Self-pay

## 2022-10-16 NOTE — Telephone Encounter (Signed)
Patient Advocate Encounter  Prior Authorization for PANTOPRAZOLE '40MG'$  has been approved.    PA# [id number] Effective dates: 3.13.24 through 3.13.25   Received notification from [facility] that prior authorization for PANTOPRAOLE '40MG'$  is required.   PA submitted via phone PA# [id number] Status is pending

## 2023-02-26 ENCOUNTER — Other Ambulatory Visit: Payer: Self-pay | Admitting: Internal Medicine

## 2023-02-26 DIAGNOSIS — E786 Lipoprotein deficiency: Secondary | ICD-10-CM

## 2023-08-03 ENCOUNTER — Other Ambulatory Visit: Payer: Self-pay | Admitting: Internal Medicine

## 2023-11-10 ENCOUNTER — Other Ambulatory Visit: Payer: Self-pay | Admitting: Internal Medicine

## 2023-12-26 ENCOUNTER — Other Ambulatory Visit: Payer: Self-pay | Admitting: Internal Medicine
